# Patient Record
Sex: Female | Born: 1965 | Race: White | Hispanic: No | Marital: Married | Smoking: Former smoker
Health system: Southern US, Community
[De-identification: ages and names within clinical notes are randomized; demographics above are authoritative.]

## PROBLEM LIST (undated history)

## (undated) DIAGNOSIS — I10 Essential (primary) hypertension: Secondary | ICD-10-CM

## (undated) HISTORY — PX: GYNECOLOGIC CRYOSURGERY: SHX857

## (undated) HISTORY — DX: Essential (primary) hypertension: I10

## (undated) HISTORY — PX: DILATION AND CURETTAGE OF UTERUS: SHX78

---

## 1998-01-03 ENCOUNTER — Emergency Department (HOSPITAL_COMMUNITY): Admission: EM | Admit: 1998-01-03 | Discharge: 1998-01-03 | Payer: Self-pay | Admitting: Emergency Medicine

## 1998-01-03 ENCOUNTER — Encounter: Payer: Self-pay | Admitting: Emergency Medicine

## 1998-01-06 ENCOUNTER — Emergency Department (HOSPITAL_COMMUNITY): Admission: EM | Admit: 1998-01-06 | Discharge: 1998-01-06 | Payer: Self-pay | Admitting: Emergency Medicine

## 1998-02-07 HISTORY — PX: AUGMENTATION MAMMAPLASTY: SUR837

## 1998-04-10 ENCOUNTER — Observation Stay (HOSPITAL_COMMUNITY): Admission: EM | Admit: 1998-04-10 | Discharge: 1998-04-11 | Payer: Self-pay | Admitting: Plastic Surgery

## 1999-08-17 ENCOUNTER — Ambulatory Visit (HOSPITAL_BASED_OUTPATIENT_CLINIC_OR_DEPARTMENT_OTHER): Admission: RE | Admit: 1999-08-17 | Discharge: 1999-08-17 | Payer: Self-pay | Admitting: Plastic Surgery

## 1999-10-13 ENCOUNTER — Ambulatory Visit (HOSPITAL_COMMUNITY): Admission: RE | Admit: 1999-10-13 | Discharge: 1999-10-13 | Payer: Self-pay | Admitting: Obstetrics and Gynecology

## 1999-10-13 ENCOUNTER — Encounter (INDEPENDENT_AMBULATORY_CARE_PROVIDER_SITE_OTHER): Payer: Self-pay

## 2001-05-02 ENCOUNTER — Other Ambulatory Visit: Admission: RE | Admit: 2001-05-02 | Discharge: 2001-05-02 | Payer: Self-pay | Admitting: Obstetrics and Gynecology

## 2002-05-30 ENCOUNTER — Other Ambulatory Visit: Admission: RE | Admit: 2002-05-30 | Discharge: 2002-05-30 | Payer: Self-pay | Admitting: Obstetrics and Gynecology

## 2002-12-23 ENCOUNTER — Other Ambulatory Visit: Admission: RE | Admit: 2002-12-23 | Discharge: 2002-12-23 | Payer: Self-pay | Admitting: Gynecology

## 2002-12-26 ENCOUNTER — Encounter (INDEPENDENT_AMBULATORY_CARE_PROVIDER_SITE_OTHER): Payer: Self-pay | Admitting: Specialist

## 2002-12-26 ENCOUNTER — Ambulatory Visit (HOSPITAL_COMMUNITY): Admission: RE | Admit: 2002-12-26 | Discharge: 2002-12-26 | Payer: Self-pay | Admitting: Gynecology

## 2002-12-26 ENCOUNTER — Ambulatory Visit (HOSPITAL_BASED_OUTPATIENT_CLINIC_OR_DEPARTMENT_OTHER): Admission: RE | Admit: 2002-12-26 | Discharge: 2002-12-26 | Payer: Self-pay | Admitting: Gynecology

## 2003-07-16 ENCOUNTER — Other Ambulatory Visit: Admission: RE | Admit: 2003-07-16 | Discharge: 2003-07-16 | Payer: Self-pay | Admitting: Gynecology

## 2004-01-25 ENCOUNTER — Encounter (INDEPENDENT_AMBULATORY_CARE_PROVIDER_SITE_OTHER): Payer: Self-pay | Admitting: Specialist

## 2004-01-25 ENCOUNTER — Inpatient Hospital Stay (HOSPITAL_COMMUNITY): Admission: AD | Admit: 2004-01-25 | Discharge: 2004-01-27 | Payer: Self-pay | Admitting: Gynecology

## 2004-01-28 ENCOUNTER — Encounter: Admission: RE | Admit: 2004-01-28 | Discharge: 2004-01-28 | Payer: Self-pay | Admitting: Gynecology

## 2004-02-28 ENCOUNTER — Encounter: Admission: RE | Admit: 2004-02-28 | Discharge: 2004-03-29 | Payer: Self-pay | Admitting: Gynecology

## 2004-03-03 ENCOUNTER — Other Ambulatory Visit: Admission: RE | Admit: 2004-03-03 | Discharge: 2004-03-03 | Payer: Self-pay | Admitting: Gynecology

## 2005-02-22 ENCOUNTER — Other Ambulatory Visit: Admission: RE | Admit: 2005-02-22 | Discharge: 2005-02-22 | Payer: Self-pay | Admitting: Obstetrics and Gynecology

## 2006-07-20 ENCOUNTER — Ambulatory Visit (HOSPITAL_COMMUNITY): Admission: RE | Admit: 2006-07-20 | Discharge: 2006-07-20 | Payer: Self-pay | Admitting: Obstetrics and Gynecology

## 2007-01-09 ENCOUNTER — Other Ambulatory Visit: Admission: RE | Admit: 2007-01-09 | Discharge: 2007-01-09 | Payer: Self-pay | Admitting: Obstetrics and Gynecology

## 2008-03-12 ENCOUNTER — Ambulatory Visit: Payer: Self-pay | Admitting: Obstetrics and Gynecology

## 2008-03-12 ENCOUNTER — Other Ambulatory Visit: Admission: RE | Admit: 2008-03-12 | Discharge: 2008-03-12 | Payer: Self-pay | Admitting: Obstetrics and Gynecology

## 2008-03-12 ENCOUNTER — Encounter: Payer: Self-pay | Admitting: Obstetrics and Gynecology

## 2009-03-19 ENCOUNTER — Ambulatory Visit: Payer: Self-pay | Admitting: Obstetrics and Gynecology

## 2009-03-19 ENCOUNTER — Other Ambulatory Visit: Admission: RE | Admit: 2009-03-19 | Discharge: 2009-03-19 | Payer: Self-pay | Admitting: Obstetrics and Gynecology

## 2009-07-27 ENCOUNTER — Ambulatory Visit: Payer: Self-pay | Admitting: Obstetrics and Gynecology

## 2009-07-29 ENCOUNTER — Ambulatory Visit: Payer: Self-pay | Admitting: Obstetrics and Gynecology

## 2010-03-31 ENCOUNTER — Encounter: Payer: Self-pay | Admitting: Obstetrics and Gynecology

## 2010-04-07 ENCOUNTER — Encounter (INDEPENDENT_AMBULATORY_CARE_PROVIDER_SITE_OTHER): Payer: BC Managed Care – PPO | Admitting: Obstetrics and Gynecology

## 2010-04-07 ENCOUNTER — Other Ambulatory Visit: Payer: Self-pay | Admitting: Obstetrics and Gynecology

## 2010-04-07 ENCOUNTER — Other Ambulatory Visit (HOSPITAL_COMMUNITY)
Admission: RE | Admit: 2010-04-07 | Discharge: 2010-04-07 | Disposition: A | Discharge status: Home / Self Care | Payer: BC Managed Care – PPO | Source: Ambulatory Visit | Attending: Obstetrics and Gynecology | Admitting: Obstetrics and Gynecology

## 2010-04-07 DIAGNOSIS — Z1231 Encounter for screening mammogram for malignant neoplasm of breast: Secondary | ICD-10-CM

## 2010-04-07 DIAGNOSIS — Z01419 Encounter for gynecological examination (general) (routine) without abnormal findings: Secondary | ICD-10-CM

## 2010-04-07 DIAGNOSIS — Z124 Encounter for screening for malignant neoplasm of cervix: Secondary | ICD-10-CM | POA: Insufficient documentation

## 2010-04-07 DIAGNOSIS — Z13220 Encounter for screening for lipoid disorders: Secondary | ICD-10-CM

## 2010-04-20 ENCOUNTER — Ambulatory Visit: Payer: BC Managed Care – PPO

## 2010-04-29 ENCOUNTER — Ambulatory Visit
Admission: RE | Admit: 2010-04-29 | Discharge: 2010-04-29 | Disposition: A | Discharge status: Home / Self Care | Payer: BC Managed Care – PPO | Source: Ambulatory Visit | Attending: Obstetrics and Gynecology | Admitting: Obstetrics and Gynecology

## 2010-04-29 DIAGNOSIS — Z1231 Encounter for screening mammogram for malignant neoplasm of breast: Secondary | ICD-10-CM

## 2010-04-30 ENCOUNTER — Other Ambulatory Visit: Payer: Self-pay | Admitting: Obstetrics and Gynecology

## 2010-04-30 DIAGNOSIS — R928 Other abnormal and inconclusive findings on diagnostic imaging of breast: Secondary | ICD-10-CM

## 2010-05-06 ENCOUNTER — Ambulatory Visit
Admission: RE | Admit: 2010-05-06 | Discharge: 2010-05-06 | Disposition: A | Discharge status: Home / Self Care | Payer: BC Managed Care – PPO | Source: Ambulatory Visit | Attending: Obstetrics and Gynecology | Admitting: Obstetrics and Gynecology

## 2010-05-06 DIAGNOSIS — R928 Other abnormal and inconclusive findings on diagnostic imaging of breast: Secondary | ICD-10-CM

## 2010-05-07 ENCOUNTER — Other Ambulatory Visit: Payer: BC Managed Care – PPO

## 2010-06-25 NOTE — H&P (Signed)
NAME:  Connie Chambers, Connie Chambers                     ACCOUNT NO.:  [id number]   MEDICAL RECORD NO.:  [id number]                   PATIENT TYPE:  AMB   LOCATION:  [facility]                                 FACILITY:  [facility]   PHYSICIAN:  Timothy P. Fontaine, M.D.           DATE OF BIRTH:  06/14/1965   DATE OF ADMISSION:  DATE OF DISCHARGE:                                HISTORY & PHYSICAL   She is being admitted to [facility] on December 26, 2002 at  1 p.m.   CHIEF COMPLAINT:  Missed AB.   HISTORY OF PRESENT ILLNESS:  A 45 year old G2, P0, AB1 female at 10 weeks by  dates who presented for her new OB appointment, was found not to have fetal  heart tones, had an ultrasound which is consistent with a missed AB showing  a sac size of eight weeks and a crown-rump length of six weeks.  No fetal  activity.  The patient was counseled for options, and she is admitted for a  suction D&C.   PAST MEDICAL HISTORY:  Uncomplicated.   PAST SURGICAL HISTORY:  1. Breast implant surgery.  2. Wisdom teeth extraction.   ALLERGIES:  None.   MEDICATIONS:  Vitamins.   REVIEW OF SYSTEMS:  Noncontributory.   SOCIAL HISTORY:  Noncontributory.   FAMILY HISTORY:  Noncontributory.   EXAM:  HEENT:  Normal.  LUNGS:  Clear.  CARDIAC:  Regular rate.  No rubs, murmurs or gallops.  ABDOMEN:  Benign.  PELVIC:  External BUS, vagina normal.  Cervix normal.  Uterus retroverted,  10-weeks size, soft.  Adnexa without masses or tenderness.   ASSESSMENT:  A 45 year old gravida 2, para 0, abortion 1 female, ten weeks  by dates, with missed abortion for suction dilatation and curettage.   I reviewed with the patient and her husband the options to include expectant  management versus a suction D&C and they want to proceed with suction D&C.  I reviewed with them what is involved with the procedure.  The expected  intraoperative and postoperative courses.  I reviewed the risks of the  procedure to include  infection, bleeding, transfusion, uterine perforation,  damage to internal organs including bowel, bladder, ureters, vessels and  nerves and  sustaining major exploratory or reparative surgery in the future surgeries  including ostomy formation.  The patient's questions were answered to her  satisfaction.  She is ready to proceed with surgery.  The patient's blood  type is O positive.                                               Timothy P. Audie Box, M.D.    TPF/MEDQ  D:  12/25/2002  T:  12/25/2002  Job:  [id number]

## 2010-06-25 NOTE — H&P (Signed)
[facility]  Patient:    Connie Chambers, Connie Chambers                  MRN: [id number] Adm. Date:  [id number] Attending:  Sharon Mt                         History and Physical  CHIEF COMPLAINT:              Desire to terminate pregnancy.  HISTORY OF PRESENT ILLNESS:   The patient is a 45 year old gravida 1 para 0 AB 1, who came to see me on October 07, 1999 with a history of not having a period since early in May 2001.  She was not sure exactly when it had happened in early May 2001.  The patient has been using contraception with birth control pills for years and has never missed a birth control pill.  She had had periods of amenorrhea earlier in the year 2001 and had been seen in my office and evaluated.  She was never pregnant during these evaluations based on laboratory testing, and also had normal TSH and prolactin.  She came back to see me on October 07, 1999 because although she had had some bleeding off and on she had had nothing since Jun 08, 1999 in the way of a period and was starting to notice some abdominal bloating.  On examination she was obviously pregnant.  Her uterus was approximately 15 week size.  Urine pregnancy test confirmed that she was pregnant.  She does not want to continue the pregnancy and has elected to undergo second trimester termination.  She understands the risks of surgery including perforation, bowel injury, uncontrollable bleeding which would require hysterectomy, and is comfortable proceeding in spite of that.  A Laminaria tent was inserted yesterday to aid in cervical dilatation.  PAST MEDICAL HISTORY:         No serious illnesses.  She has had breast implant surgery.  SOCIAL HISTORY:               She is a smoker and a drinker, although she does not drink heavily.  FAMILY HISTORY:               Noncontributory.  REVIEW OF SYSTEMS:            HEENT: Negative.  CARDIAC: Negative. RESPIRATORY: Negative.  GI:  Abdominal bloating.  GU: Negative.  NEUROMUSCULAR: Negative.  PHYSICAL EXAMINATION:  GENERAL:                      The patient is a well-developed, well-nourished female in no acute distress.  VITAL SIGNS:                  Blood pressure 110/70, pulse 80 and regular, respirations 16 and unlabored.  She is afebrile.  HEENT:                        Within normal limits.  NECK:                         Supple.  Trachea midline.  Thyroid not enlarged.  LUNGS:                        Clear to auscultation and percussion.  HEART:  No thrills, heaves, or murmurs.  BREAST:                       No masses.  ABDOMEN:                      Soft, without rebound or guarding or masses except for the uterine fundus being palpable slightly half way to the umbilicus, but not quite half way to the umbilicus.  PELVIC:                       External genitalia within normal limits.  Cervix soft.  Uterus enlarged to approximately 15 week size.  Adnexa palpably normal.  RECTAL:                       Confirmatory.  ADMISSION IMPRESSION:         Intrauterine pregnancy with desire for termination.  PLAN:                         Therapeutic abortion. DD:  10/13/99 TD:  10/14/99 Job: [id number]

## 2010-06-25 NOTE — H&P (Signed)
Connie Chambers, Connie Chambers           ACCOUNT NO.:  [id number]   MEDICAL RECORD NO.:  [id number]          PATIENT TYPE:  INP   LOCATION:  9373                          FACILITY:  [facility]   PHYSICIAN:  Juan H. Lily Peer, M.D.DATE OF BIRTH:  [birth year]   DATE OF ADMISSION:  01/25/2004  DATE OF DISCHARGE:                                HISTORY & PHYSICAL   CHIEF COMPLAINT:  Contractions.   HISTORY OF PRESENT ILLNESS:  The patient is a 45 year old, gravida 3, para  0, AB 2 with a last menstrual period May 02, 2003.  Estimated date of  confinement February 07, 2004.  The patient is currently 38-2/7th weeks  gestation who presented to [facility] today complaining of  contractions every five to six minutes apart which started earlier this  evening.  The patient had been seen in the office at [facility]  on Friday and was told that she had what appeared to be labile PIH which  resolved with the patient being placed in the lateral decubitus position.  Today, she denied any right upper quadrant pain, any visual disturbances, or  any headaches and her systolic blood pressure was found to be 165 and  diastolic between 90 and 100, but returned down to 88 in the lateral  decubitus position on the diastolic.  Her deep tendon reflexes were 1+.  No  clonus, no edema.  No right upper quadrant pain.  No visual disturbances.   The patient's prenatal history was significant for the fact that she had  genetic amniocentesis at 15 to [redacted] weeks gestation which was reported to be  normal but the patient did not want to know the sex of the baby.  Her GBC  culture was negative and on exam there was evidence of a bloody show.  Her  cervix was 4 cm dilated, 90% effaced, and -1 station.  Fetal heart rate  tracing was reassuring.   ALLERGIES:  The patient denies any allergies.   PAST MEDICAL HISTORY:  Had anemia during this pregnancy and was on  supplemental iron along with her vitamin.  She was  tested in the past for  cystic fibrosis and had been negative.  Recent genetic amniocentesis was  reported to be normal.   REVIEW OF SYMPTOMS:  See [facility] form.   PHYSICAL EXAMINATION:  GENERAL:  Well-developed, well-nourished female.  HEENT:  Unremarkable.  NECK:  Supple.  Trachea midline.  No carotid bruits or thyromegaly.  LUNGS:  Clear to auscultation without any rhonchi or wheezes.  HEART:  Regular rate and rhythm.  No murmurs or gallops.  BREASTS:  Not done.  ABDOMEN: Gravid uterus.  Vertex presentation by [facility] maneuver.  PELVIC:  Cervix thin, 4 cm, 90+ effacement, -1 station.  EXTREMITIES:  DTRs 1+.  Negative clonus.  No edema.   ASSESSMENT:  A 45 year old, gravid 3, para 0, abortions 2 at 38-1/2 weeks  estimated gestational age in labor, advanced cervical dilatation, pregnancy-  induced hypertension.  We will proceed with pregnancy-induced  hypertension labs and continue to monitor blood pressure in the event that  she may need to be placed on magnesium  sulfate for seizure prophylaxis.  She  is group beta strep negative and prenatal labs (see [place] form).   PLAN:  As per assessment above.     Juan   JHF/MEDQ  D:  01/25/2004  T:  01/25/2004  Job:  [id number]

## 2010-06-25 NOTE — Op Note (Signed)
NAME:  Connie Chambers, Connie Chambers                     ACCOUNT NO.:  [id number]   MEDICAL RECORD NO.:  [id number]                   PATIENT TYPE:  AMB   LOCATION:  [facility]                                 FACILITY:  [facility]   PHYSICIAN:  Timothy P. Fontaine, M.D.           DATE OF BIRTH:  January 02, 1966   DATE OF PROCEDURE:  12/26/2002  DATE OF DISCHARGE:                                 OPERATIVE REPORT   [facility]   PREOPERATIVE DIAGNOSIS:  Missed abortion.   POSTOPERATIVE DIAGNOSIS:  Missed abortion.   PROCEDURE:  Suction D&C.   SURGEON:  Timothy P. Fontaine, M.D.   ANESTHETIC:  General.   ESTIMATED BLOOD LOSS:  Less than 50 mL.   COMPLICATIONS:  None.   SPECIMENS:  POC.   FINDINGS:  Uterus 10-weeks size, retroverted.  Gross POC retrieved, #8  suction curet.  No evidence of perforation.  EUA shows uterus to be  retroverted, 10-weeks size, adnexa without masses.   PROCEDURE:  Patient was taken to the operating room, underwent general  anesthesia, and was placed in the low dorsal lithotomy position.  She  received a vaginal-perineal preparation with Betadine solution.  Bladder  emptied with in-and-out Foley catheterization, and EUA was performed.  The  patient was then draped in the usual fashion.  The cervix visualized with a  speculum, anterior lip gripped with a single-tooth tenaculum and the cervix  was gently gradually dilated to admit a #8 suction curet.  The suction  curettage was then performed with gross POC retrieved.  A gentle sharp  curettage was then performed to assure complete emptying.  The instruments  were removed, adequate hemostasis visualized, patient placed in the supine  position, awakened without difficulty, and taken to the recovery room in  good condition having tolerated the procedure well and receiving Toradol  before leaving the room.                                               Timothy P. Audie Box, M.D.    TPF/MEDQ  D:  12/26/2002  T:   12/26/2002  Job:  [id number]

## 2010-06-25 NOTE — Op Note (Signed)
[facility]  Patient:    Connie Chambers, Connie Chambers                    MRN: [id number] Proc. Date: 10/13/99 Attending:  Rande Brunt. Eda Paschal, M.D.                           Operative Report  PREOPERATIVE DIAGNOSIS:       Therapeutic abortion.  POSTOPERATIVE DIAGNOSIS:      Therapeutic abortion.  OPERATION:                    Dilatation and evacuation.  SURGEON:                      Daniel L. Eda Paschal, M.D.  ANESTHESIA:                   General endotracheal.  FINDINGS:                     External and vaginal exam is within normal limits.  Cervix is clean.  It is significantly dilated from the laminaria tent that had been placed the night before surgery.  Uterus is enlarged to 15-16 weeks size by intrauterine pregnancy.  Adnexa were not palpably enlarged.  DESCRIPTION OF PROCEDURE:     The patient was taken to the operating room, given general endotracheal anesthesia, and then placed in the dorsal lithotomy position.  Her laminaria tent was removed, and there had been significant dilatation of the cervix.  She was prepped and draped in the usual sterile manner.  A single-tooth tenaculum was placed in the anterior lip of the cervix, and the cervix was progressively dilated to a #45 Pratt dilator.  The products of conception were then removed with a #16 suction curette attached to large tubing.  Some of the tissue had to be removed in pieces with a variety of instruments utilized for second trimester terminations which included sponge sticks and other specially shaped curettes to grasp tissue. Sharp curettage was never done at any time.  Once the uterus appeared to be evacuated, all tissue was checked for completeness, and it was felt that the entire conception had been removed.  The patient was given Pitocin IV.  Her uterus contracted well without significant bleeding.  Estimated blood loss was less than 300 cc with none replaced.  The patient tolerated  the procedure well and left the operating room in satisfactory condition. DD:  10/13/99 TD:  10/14/99 Job: [id number]

## 2010-12-14 ENCOUNTER — Other Ambulatory Visit: Payer: Self-pay | Admitting: Obstetrics and Gynecology

## 2010-12-14 NOTE — Telephone Encounter (Signed)
You may refilled her Xanax with one additional refill. The other medicine is for her blood pressure and I am not to 1 who prescribes it.

## 2010-12-17 MED ORDER — ALPRAZOLAM 0.25 MG PO TABS: 0.2500 mg | ORAL_TABLET | Freq: Every evening | ORAL | Status: AC | PRN

## 2010-12-17 NOTE — Telephone Encounter (Signed)
Called in both RX's to pharmacy.

## 2010-12-17 NOTE — Telephone Encounter (Signed)
Spoke with pt. And states you had refilled her Bystolic rx for per previously because the cardiologist she had been seeing left the practice she had been going to & now has an appt. With Dr. Alanda Amass December 2012. PT. Asking if she can get just #30 more until she sees new cardiologist next month?

## 2010-12-17 NOTE — Telephone Encounter (Signed)
Okay to refill xanax with two refills. Okay to refill bystolic until she sees Dr. Alanda Amass in dec.

## 2011-04-01 ENCOUNTER — Other Ambulatory Visit: Payer: Self-pay | Admitting: *Deleted

## 2011-04-01 MED ORDER — ALPRAZOLAM 0.25 MG PO TABS: 0.2500 mg | ORAL_TABLET | Freq: Every evening | ORAL | Status: AC | PRN

## 2011-04-01 NOTE — Telephone Encounter (Signed)
rx called in

## 2011-08-03 ENCOUNTER — Other Ambulatory Visit: Payer: Self-pay | Admitting: *Deleted

## 2011-08-03 MED ORDER — ALPRAZOLAM 0.25 MG PO TABS: 0.2500 mg | ORAL_TABLET | Freq: Every evening | ORAL | Status: DC | PRN

## 2011-08-03 NOTE — Telephone Encounter (Signed)
rx called in

## 2011-08-05 ENCOUNTER — Encounter: Payer: Self-pay | Admitting: Gynecology

## 2011-08-05 DIAGNOSIS — I10 Essential (primary) hypertension: Secondary | ICD-10-CM | POA: Insufficient documentation

## 2011-08-17 ENCOUNTER — Encounter: Payer: Self-pay | Admitting: Obstetrics and Gynecology

## 2011-08-17 ENCOUNTER — Ambulatory Visit (INDEPENDENT_AMBULATORY_CARE_PROVIDER_SITE_OTHER): Payer: BC Managed Care – PPO | Admitting: Obstetrics and Gynecology

## 2011-08-17 VITALS — BP 124/78 | Ht 64.0 in | Wt 124.0 lb

## 2011-08-17 DIAGNOSIS — Z01419 Encounter for gynecological examination (general) (routine) without abnormal findings: Secondary | ICD-10-CM

## 2011-08-17 LAB — CBC WITH DIFFERENTIAL/PLATELET
Basophils Relative: 0 % (ref 0–1)
Eosinophils Absolute: 0.1 10*3/uL (ref 0.0–0.7)
Lymphs Abs: 1.9 10*3/uL (ref 0.7–4.0)
MCH: 31.2 pg (ref 26.0–34.0)
Neutro Abs: 5.1 10*3/uL (ref 1.7–7.7)
Neutrophils Relative %: 67 % (ref 43–77)
Platelets: 283 10*3/uL (ref 150–400)
RBC: 4.07 MIL/uL (ref 3.87–5.11)
WBC: 7.6 10*3/uL (ref 4.0–10.5)

## 2011-08-17 NOTE — Progress Notes (Signed)
Patient came to see me today for her annual GYN exam. She is having regular cycles without problems. She still sees the cardiologist who treats her blood pressure. She had a normal lipid profile here last year. She contracepts by vasectomy. She is due for a mammogram. She is having no irregular bleeding. She is having no pelvic pain. 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 She has had normal Pap smears ever since. Her last Pap smear was last year.  Physical examination: Connie Chambers present. HEENT within normal limits. Neck: Thyroid not large. No masses. Supraclavicular nodes: not enlarged. Breasts: Examined in both sitting and lying  position. No skin changes and no masses. Abdomen: Soft no guarding rebound or masses or hernia. Pelvic: External: Within normal limits. BUS: Within normal limits. Vaginal:within normal limits. Good estrogen effect. No evidence of cystocele rectocele or enterocele. Cervix: clean. Uterus: Normal size and shape. Adnexa: No masses. Rectovaginal exam: Confirmatory and negative. Extremities: Within normal limits.  Assessment: Normal GYN exam. Hypertension.  Plan: Mammogram.The new Pap smear guidelines were discussed with the patient. No pap done.

## 2011-08-18 LAB — URINALYSIS W MICROSCOPIC + REFLEX CULTURE
Bacteria, UA: NONE SEEN
Bilirubin Urine: NEGATIVE
Glucose, UA: NEGATIVE mg/dL
Hgb urine dipstick: NEGATIVE
Ketones, ur: NEGATIVE mg/dL
Protein, ur: NEGATIVE mg/dL
Squamous Epithelial / LPF: NONE SEEN

## 2011-10-18 ENCOUNTER — Other Ambulatory Visit: Payer: Self-pay | Admitting: *Deleted

## 2011-10-18 MED ORDER — ALPRAZOLAM 0.25 MG PO TABS: 0.2500 mg | ORAL_TABLET | Freq: Every evening | ORAL | Status: AC | PRN

## 2011-11-28 ENCOUNTER — Other Ambulatory Visit: Payer: Self-pay | Admitting: Obstetrics and Gynecology

## 2012-01-20 ENCOUNTER — Telehealth: Payer: Self-pay | Admitting: *Deleted

## 2012-01-20 MED ORDER — ALPRAZOLAM 0.25 MG PO TABS: ORAL_TABLET | ORAL | Status: DC

## 2012-01-20 NOTE — Telephone Encounter (Signed)
(  pt aware you are out of office) Pt calling requesting refill on Xanax 0.25 mg tablets. Okay to fill? Refills?

## 2012-01-20 NOTE — Addendum Note (Signed)
Addended by: Aura Camps on: 01/20/2012 10:04 AM   Modules accepted: Orders

## 2012-01-20 NOTE — Telephone Encounter (Signed)
Give 30 pills with 5 refills.

## 2012-01-20 NOTE — Telephone Encounter (Signed)
rx called into pharmacy

## 2012-02-28 ENCOUNTER — Other Ambulatory Visit: Payer: Self-pay | Admitting: Obstetrics and Gynecology

## 2012-02-28 NOTE — Telephone Encounter (Signed)
This prescription request denied. I spoke with Connie Chambers at Pharmacy and she said she has a RX at pharmacy with refills therefore this refill not needed. KW The patient takes this due to anxiety per Dr Delorise Royals note in 2011. KW

## 2012-03-05 ENCOUNTER — Other Ambulatory Visit: Payer: Self-pay | Admitting: Obstetrics and Gynecology

## 2012-03-05 NOTE — Telephone Encounter (Signed)
Per Dr Cephas Darby last note refill ok x5. Pt takes for sleep. KW

## 2012-06-29 ENCOUNTER — Other Ambulatory Visit: Payer: Self-pay | Admitting: Family Medicine

## 2012-06-29 DIAGNOSIS — R2241 Localized swelling, mass and lump, right lower limb: Secondary | ICD-10-CM

## 2012-07-03 ENCOUNTER — Ambulatory Visit
Admission: RE | Admit: 2012-07-03 | Discharge: 2012-07-03 | Disposition: A | Discharge status: Home / Self Care | Payer: Managed Care, Other (non HMO) | Source: Ambulatory Visit | Attending: Family Medicine | Admitting: Family Medicine

## 2012-07-03 DIAGNOSIS — R2241 Localized swelling, mass and lump, right lower limb: Secondary | ICD-10-CM

## 2012-08-17 ENCOUNTER — Encounter: Payer: Self-pay | Admitting: Gynecology

## 2012-09-06 ENCOUNTER — Ambulatory Visit (INDEPENDENT_AMBULATORY_CARE_PROVIDER_SITE_OTHER): Payer: Managed Care, Other (non HMO) | Admitting: Gynecology

## 2012-09-06 ENCOUNTER — Encounter: Payer: Self-pay | Admitting: Gynecology

## 2012-09-06 VITALS — BP 112/68 | Ht 64.0 in | Wt 118.0 lb

## 2012-09-06 DIAGNOSIS — Z01419 Encounter for gynecological examination (general) (routine) without abnormal findings: Secondary | ICD-10-CM

## 2012-09-06 MED ORDER — ALPRAZOLAM 0.25 MG PO TABS: ORAL_TABLET | ORAL | Status: DC

## 2012-09-06 NOTE — Patient Instructions (Signed)
Call to Schedule your mammogram  Facilities in [place]: 1)  [facility], [address] Phone: [phone] 2)  [facility], [address] Phone: [phone] 3)  Dr. Bertrand at [facility]  [address] Phone: [phone]     Mammogram A mammogram is an X-ray test to find changes in a woman's breast. You should get a mammogram if:  You are 40 years of age or older  You have risk factors.   Your doctor recommends that you have one.  BEFORE THE TEST  Do not schedule the test the week before your period, especially if your breasts are sore during this time.  On the day of your mammogram:  Wash your breasts and armpits well. After washing, do not put on any deodorant or talcum powder on until after your test.   Eat and drink as you usually do.   Take your medicines as usual.   If you are diabetic and take insulin, make sure you:   Eat before coming for your test.   Take your insulin as usual.   If you cannot keep your appointment, call before the appointment to cancel. Schedule another appointment.  TEST  You will need to undress from the waist up. You will put on a hospital gown.   Your breast will be put on the mammogram machine, and it will press firmly on your breast with a piece of plastic called a compression paddle. This will make your breast flatter so that the machine can X-ray all parts of your breast.   Both breasts will be X-rayed. Each breast will be X-rayed from above and from the side. An X-ray might need to be taken again if the picture is not good enough.   The mammogram will last about 15 to 30 minutes.  AFTER THE TEST Finding out the results of your test Ask when your test results will be ready. Make sure you get your test results.  Document Released: 04/22/2008 Document Revised: 01/13/2011 Document Reviewed: 04/22/2008 [employer] Patient  Information 2012 [employer].   

## 2012-09-06 NOTE — Progress Notes (Signed)
Connie Chambers Sep 08, 1965 [id number]        47 y.o.  [id number] for annual exam.  Former patient of Dr. Eda Paschal doing well.  Past medical history,surgical history, medications, allergies, family history and social history were all reviewed and documented in the EPIC chart.  ROS:  Performed and pertinent positives and negatives are included in the history, assessment and plan .  Exam: Kim assistant Filed Vitals:   09/06/12 1537  BP: 112/68  Height: 5\' 4"  (1.626 m)  Weight: 118 lb (53.524 kg)   General appearance  Normal Skin grossly normal Head/Neck normal with no cervical or supraclavicular adenopathy thyroid normal Lungs  clear Cardiac RR, without RMG Abdominal  soft, nontender, without masses, organomegaly or hernia Breasts  examined lying and sitting. Right without masses, retractions, discharge or axillary adenopathy. Left with distortion from implant complication. No masses retractions discharge adenopathy. Bilateral implants noted. Pelvic  Ext/BUS/vagina  normal   Cervix  normal   Uterus  anteverted, normal size, shape and contour, midline and mobile nontender   Adnexa  Without masses or tenderness    Anus and perineum  normal   Rectovaginal  normal sphincter tone without palpated masses or tenderness.    Assessment/Plan:  47 y.o. [id number] female for annual exam, regular menses, vasectomy birth control.   1. Breakthrough bleeding. Patient has had midcycle spotting for several cycles over the past several months. Not consistently each month but has happened several times. No pain or other symptoms. Recommend observation at present. If it continues for the next several cycles she knows to call me and we'll start with sonohysterogram to rule out polyp or other structural abnormalities. If resolves and she resumes regular monthly menses then we will follow. 2. Pap smear 2012. No Pap smear done today. History of cryosurgery in college with normal Pap smears since then. Plan  repeat Pap smear next year a 3 year interval. 3. Mammography overdue. Patient is to schedule and agrees to do so. SBE monthly reviewed. 4. Anxiety. Patient uses Xanax 0.25 mg when necessary for situational anxiety. #30 with 4 refills provided. 5. Health maintenance. No blood work done as it is all done through her cardiologist's office. Followup one year, sooner as needed.  Note: This document was prepared with digital dictation and possible smart phrase technology. Any transcriptional errors that result from this process are unintentional.   Dara Lords MD, 4:05 PM 09/06/2012

## 2012-09-07 LAB — URINALYSIS W MICROSCOPIC + REFLEX CULTURE
Glucose, UA: NEGATIVE mg/dL
Leukocytes, UA: NEGATIVE
Protein, ur: NEGATIVE mg/dL
Squamous Epithelial / LPF: NONE SEEN
Urobilinogen, UA: 0.2 mg/dL (ref 0.0–1.0)

## 2012-09-12 ENCOUNTER — Encounter: Payer: Self-pay | Admitting: Obstetrics and Gynecology

## 2012-09-24 ENCOUNTER — Other Ambulatory Visit: Payer: Self-pay | Admitting: Cardiology

## 2012-09-24 ENCOUNTER — Other Ambulatory Visit: Payer: Self-pay | Admitting: Gynecology

## 2012-09-24 NOTE — Telephone Encounter (Signed)
At her office visit 09/06/2012 I have prescribed Xanax #30 with 4 refills. Can you check with the pharmacy to see if this prescription went through.

## 2012-09-24 NOTE — Telephone Encounter (Signed)
I called in 09/06/12 Rx today. Pharmacy had not received it prior.  Cannot be prescribed electronically.

## 2012-09-24 NOTE — Telephone Encounter (Signed)
Rx was sent to pharmacy electronically. 

## 2012-12-21 ENCOUNTER — Ambulatory Visit (INDEPENDENT_AMBULATORY_CARE_PROVIDER_SITE_OTHER): Payer: Managed Care, Other (non HMO) | Admitting: Gynecology

## 2012-12-21 ENCOUNTER — Encounter: Payer: Self-pay | Admitting: Gynecology

## 2012-12-21 DIAGNOSIS — IMO0002 Reserved for concepts with insufficient information to code with codable children: Secondary | ICD-10-CM

## 2012-12-21 DIAGNOSIS — S01512A Laceration without foreign body of oral cavity, initial encounter: Secondary | ICD-10-CM

## 2012-12-21 NOTE — Progress Notes (Signed)
The patient presents having undergone any bikini waxing today. When the person pulled the wax off she received a laceration in her labial area.   Exam with Selena Batten assistant External BUS vagina with linear superficial laceration right periclitoral fold along junction of the labia majora and labia minora to the posterior fourchette. Reapproximates on its on. No bleeding. Cervix normal. Uterus normal size midline mobile nontender. Adnexa without masses or tenderness.  Assessment and plan: Superficial right labial laceration secondary to waxing. Reapproximates nicely on its on. No active bleeding. Do not feel sutures needed for reapproximation reinforcement at this point. Recommend ice packs over the area for the next 48 hours. Sitz baths 3 times a day. Followup if area does not heal over the next one to 2 weeks. Certainly sooner if increasing discomfort, drainage or any evidence of infection.

## 2012-12-21 NOTE — Patient Instructions (Signed)
Ice packs to the vulva over the next 48 hours. Sitz baths 3 times daily. Followup if condition persists or worsens

## 2013-04-22 ENCOUNTER — Other Ambulatory Visit: Payer: Self-pay

## 2013-04-22 DIAGNOSIS — Z1231 Encounter for screening mammogram for malignant neoplasm of breast: Secondary | ICD-10-CM

## 2013-05-02 ENCOUNTER — Ambulatory Visit
Admission: RE | Admit: 2013-05-02 | Discharge: 2013-05-02 | Disposition: A | Discharge status: Home / Self Care | Payer: Managed Care, Other (non HMO) | Source: Ambulatory Visit

## 2013-05-02 DIAGNOSIS — Z1231 Encounter for screening mammogram for malignant neoplasm of breast: Secondary | ICD-10-CM

## 2013-05-06 ENCOUNTER — Other Ambulatory Visit: Payer: Self-pay | Admitting: Gynecology

## 2013-05-07 NOTE — Telephone Encounter (Signed)
Called into pharmacy

## 2013-09-24 ENCOUNTER — Encounter: Payer: Self-pay | Admitting: Gynecology

## 2013-09-24 ENCOUNTER — Ambulatory Visit (INDEPENDENT_AMBULATORY_CARE_PROVIDER_SITE_OTHER): Payer: Managed Care, Other (non HMO) | Admitting: Gynecology

## 2013-09-24 ENCOUNTER — Other Ambulatory Visit (HOSPITAL_COMMUNITY)
Admission: RE | Admit: 2013-09-24 | Discharge: 2013-09-24 | Disposition: A | Discharge status: Home / Self Care | Payer: Managed Care, Other (non HMO) | Source: Ambulatory Visit | Attending: Gynecology | Admitting: Gynecology

## 2013-09-24 VITALS — BP 110/66 | Ht 64.0 in | Wt 122.0 lb

## 2013-09-24 DIAGNOSIS — Z01419 Encounter for gynecological examination (general) (routine) without abnormal findings: Secondary | ICD-10-CM | POA: Diagnosis present

## 2013-09-24 DIAGNOSIS — N920 Excessive and frequent menstruation with regular cycle: Secondary | ICD-10-CM

## 2013-09-24 DIAGNOSIS — Z1151 Encounter for screening for human papillomavirus (HPV): Secondary | ICD-10-CM | POA: Diagnosis present

## 2013-09-24 DIAGNOSIS — N921 Excessive and frequent menstruation with irregular cycle: Secondary | ICD-10-CM

## 2013-09-24 LAB — LIPID PANEL
Cholesterol: 165 mg/dL (ref 0–200)
HDL: 86 mg/dL (ref >39)
LDL CALC: 67 mg/dL (ref 0–99)
Total CHOL/HDL Ratio: 1.9 Ratio
Triglycerides: 59 mg/dL (ref <150)
VLDL: 12 mg/dL (ref 0–40)

## 2013-09-24 LAB — CBC WITH DIFFERENTIAL/PLATELET
BASOS ABS: 0 10*3/uL (ref 0.0–0.1)
BASOS PCT: 0 % (ref 0–1)
Eosinophils Absolute: 0.1 10*3/uL (ref 0.0–0.7)
Eosinophils Relative: 1 % (ref 0–5)
HCT: 35.7 % — ABNORMAL LOW (ref 36.0–46.0)
HEMOGLOBIN: 12.2 g/dL (ref 12.0–15.0)
Lymphocytes Relative: 19 % (ref 12–46)
Lymphs Abs: 1 10*3/uL (ref 0.7–4.0)
MCH: 31.4 pg (ref 26.0–34.0)
MCHC: 34.2 g/dL (ref 30.0–36.0)
MCV: 91.8 fL (ref 78.0–100.0)
MONOS PCT: 9 % (ref 3–12)
Monocytes Absolute: 0.5 10*3/uL (ref 0.1–1.0)
NEUTROS ABS: 3.8 10*3/uL (ref 1.7–7.7)
NEUTROS PCT: 71 % (ref 43–77)
Platelets: 243 10*3/uL (ref 150–400)
RBC: 3.89 MIL/uL (ref 3.87–5.11)
RDW: 13 % (ref 11.5–15.5)
WBC: 5.4 10*3/uL (ref 4.0–10.5)

## 2013-09-24 LAB — COMPREHENSIVE METABOLIC PANEL
ALBUMIN: 4.4 g/dL (ref 3.5–5.2)
ALK PHOS: 41 U/L (ref 39–117)
ALT: 9 U/L (ref 0–35)
AST: 21 U/L (ref 0–37)
BUN: 9 mg/dL (ref 6–23)
CO2: 26 mEq/L (ref 19–32)
Calcium: 9.1 mg/dL (ref 8.4–10.5)
Chloride: 102 mEq/L (ref 96–112)
Creat: 1.05 mg/dL (ref 0.50–1.10)
Glucose, Bld: 76 mg/dL (ref 70–99)
POTASSIUM: 4.3 meq/L (ref 3.5–5.3)
SODIUM: 137 meq/L (ref 135–145)
TOTAL PROTEIN: 7.1 g/dL (ref 6.0–8.3)
Total Bilirubin: 0.5 mg/dL (ref 0.2–1.2)

## 2013-09-24 MED ORDER — ALPRAZOLAM 0.25 MG PO TABS: ORAL_TABLET | ORAL | Status: DC

## 2013-09-24 NOTE — Addendum Note (Signed)
Addended by: Dayna BarkerGARDNER, Benson Porcaro K on: 09/24/2013 11:58 AM   Modules accepted: Orders

## 2013-09-24 NOTE — Patient Instructions (Signed)
Follow up for lab results and ultrasound as scheduled. 

## 2013-09-24 NOTE — Progress Notes (Signed)
Connie SarasStephanie M Salley [birth year] [id number]        48 y.o.  [id number] for annual exam.  Several issues noted below.  Past medical history,surgical history, problem list, medications, allergies, family history and social history were all reviewed and documented as reviewed in the EPIC chart.  ROS:  12 system ROS performed with pertinent positives and negatives included in the history, assessment and plan.   Additional significant findings :  None   Exam: [facility] personassistant Filed Vitals:   09/24/13 1059  BP: 110/66  Height: 5\' 4"  (1.626 m)  Weight: 122 lb (55.339 kg)   General appearance:  Normal affect, orientation and appearance. Skin: Grossly normal HEENT: Without gross lesions.  No cervical or supraclavicular adenopathy. Thyroid normal.  Lungs:  Clear without wheezing, rales or rhonchi Cardiac: RR, without RMG Abdominal:  Soft, nontender, without masses, guarding, rebound, organomegaly or hernia Breasts:  Examined lying and sitting without masses, retractions, discharge or axillary adenopathy. Bilateral implants noted. Old left breast distortion from her surgery. Pelvic:  Ext/BUS/vagina normal  Cervix normal. Pap/HPV  Uterus anteverted, normal size, shape and contour, midline and mobile nontender   Adnexa  Without masses or tenderness    Anus and perineum  Normal   Rectovaginal  Normal sphincter tone without palpated masses or tenderness.    Assessment/Plan:  48 y.o. [id number] female for annual exam with heavy menses and intermenstrual spotting, vasectomy birth control.   1. Menorrhagia/intermenstrual spotting. Seems to be worsening over the past year. Exam overall is normal. Will check baseline FSH TSH hemoglobin and sonohysterogram. Various scenarios to include hormonal dysfunction, polyps, leiomyoma reviewed. Options to include hysteroscopic resection, hormonal manipulation, IUD, endometrial ablation, hysterectomy also discussed. Patient will followup for lab results and sonohysterogram  and then we'll further review options. 2. Pap smear 2012. Pap/HPV today. History of cryosurgery in college with normal Pap smears afterwards. Assuming negative then plan 3-5 year followup Pap per current screening guidelines. 3. Mammography 04/2013. Continue with annual mammography. 4. Anxiety. Patient uses Xanax 0.25 mg intermittently for anxiety. Does well with this. #30 with 3 refills provided. 5. Health maintenance. Baseline CBC comprehensive metabolic panel lipid profile urinalysis ordered with above lab work. Followup for lab results and sonohysterogram.   Note: This document was prepared with digital dictation and possible smart phrase technology. Any transcriptional errors that result from this process are unintentional.   Dara LordsFONTAINE,Risha Barretta P MD, 11:29 AM 09/24/2013

## 2013-09-25 LAB — URINALYSIS W MICROSCOPIC + REFLEX CULTURE
Bacteria, UA: NONE SEEN
Bilirubin Urine: NEGATIVE
CASTS: NONE SEEN
CRYSTALS: NONE SEEN
Glucose, UA: NEGATIVE mg/dL
Hgb urine dipstick: NEGATIVE
Ketones, ur: NEGATIVE mg/dL
LEUKOCYTES UA: NEGATIVE
NITRITE: NEGATIVE
PH: 6 (ref 5.0–8.0)
Protein, ur: NEGATIVE mg/dL
SPECIFIC GRAVITY, URINE: 1.006 (ref 1.005–1.030)
Squamous Epithelial / LPF: NONE SEEN
Urobilinogen, UA: 0.2 mg/dL (ref 0.0–1.0)

## 2013-09-25 LAB — FOLLICLE STIMULATING HORMONE: FSH: 8.9 m[IU]/mL

## 2013-09-25 LAB — TSH: TSH: 1.13 u[IU]/mL (ref 0.350–4.500)

## 2013-09-26 ENCOUNTER — Other Ambulatory Visit: Payer: Self-pay | Admitting: Gynecology

## 2013-09-26 DIAGNOSIS — N921 Excessive and frequent menstruation with irregular cycle: Secondary | ICD-10-CM

## 2013-09-26 LAB — CYTOLOGY - PAP

## 2013-10-16 ENCOUNTER — Ambulatory Visit: Payer: Managed Care, Other (non HMO) | Admitting: Gynecology

## 2013-10-16 ENCOUNTER — Other Ambulatory Visit: Payer: Managed Care, Other (non HMO)

## 2013-10-21 ENCOUNTER — Other Ambulatory Visit: Payer: Managed Care, Other (non HMO)

## 2013-10-21 ENCOUNTER — Ambulatory Visit: Payer: Managed Care, Other (non HMO) | Admitting: Gynecology

## 2013-12-09 ENCOUNTER — Encounter: Payer: Self-pay | Admitting: Gynecology

## 2014-09-30 ENCOUNTER — Other Ambulatory Visit: Payer: Self-pay

## 2014-09-30 DIAGNOSIS — Z1231 Encounter for screening mammogram for malignant neoplasm of breast: Secondary | ICD-10-CM

## 2014-10-24 ENCOUNTER — Ambulatory Visit (INDEPENDENT_AMBULATORY_CARE_PROVIDER_SITE_OTHER): Payer: Managed Care, Other (non HMO) | Admitting: Cardiology

## 2014-10-24 ENCOUNTER — Encounter: Payer: Self-pay | Admitting: Cardiology

## 2014-10-24 VITALS — BP 118/88 | HR 57 | Ht 64.0 in | Wt 121.0 lb

## 2014-10-24 DIAGNOSIS — Z8679 Personal history of other diseases of the circulatory system: Secondary | ICD-10-CM

## 2014-10-24 DIAGNOSIS — Z87898 Personal history of other specified conditions: Secondary | ICD-10-CM

## 2014-10-24 DIAGNOSIS — I10 Essential (primary) hypertension: Secondary | ICD-10-CM

## 2014-10-24 NOTE — Patient Instructions (Signed)
No change with current medications   Your physician recommends that you schedule a follow-up appointment on an as needed basis.

## 2014-10-24 NOTE — Progress Notes (Signed)
PATIENT: Connie Chambers MRN: [id number] DOB: [birth year] PCP: Dara Lords, MD  Clinic Note: Chief Complaint  Patient presents with  . Follow-up    Patient has no complaints.    HPI: Connie Chambers is a 49 y.o. female with a PMH below who presents today for management of HTN.  Last seen Jan 2014 - was on Bystolic then.  No longer on it - b/c did not have refills.  Interval History: Feels good & exercises.  No major complaints.  She has been off of Bystolic now for almost a year - with only one elevated blood pressure reading that she can tell me in last year.  She is here today for reevaluation because she had a BP check @ [facility] 160/110 mHg.  Otherwise, she really has not had any evaluations at her PCPs office or when she checks herself of elevated blood pressures. No headaches or blurred vision. No palpitations.  She is usually very active, watching her diet and exercises routinely.  Cardiovascular ROS: no chest pain or dyspnea on exertion negative for - edema, irregular heartbeat, orthopnea, palpitations, paroxysmal nocturnal dyspnea, rapid heart rate or shortness of breath, syncope/near-syncope, TIA/amaurosis fugax  Past Medical History  Diagnosis Date  . Hypertension     Well-controlled - without medications  . Cervical dysplasia [employer]    Normal Pap smears since    Prior Cardiac Evaluation and Past Surgical History: Past Surgical History  Procedure Laterality Date  . Dilation and curettage of uterus    . Augmentation mammaplasty    . Gynecologic cryosurgery  In college    No Known Allergies  Current Outpatient Prescriptions  Medication Sig Dispense Refill  . ALPRAZolam (XANAX) 0.25 MG tablet TAKE 1 TABLET BY MOUTH AT BEDTIME 30 tablet 3   No current facility-administered medications for this visit.    Social History   Social History Narrative    family history includes Stroke in her paternal grandfather.  ROS: A comprehensive Review of  Systems - was performed Review of Systems  Constitutional: Negative for weight loss and malaise/fatigue.  HENT: Negative for nosebleeds.   Respiratory: Negative for shortness of breath.   Cardiovascular: Negative for claudication.  Gastrointestinal: Negative for blood in stool and melena.  Genitourinary: Negative for hematuria.  Musculoskeletal: Negative for myalgias and joint pain.  Neurological: Negative for dizziness.  Psychiatric/Behavioral: Negative for depression. The patient is not nervous/anxious.   All other systems reviewed and are negative.   PHYSICAL EXAM BP 118/88 mmHg  Pulse 57  Ht  (1.626 m)  Wt 121 lb (54.885 kg)  BMI 20.76 kg/m2 General appearance: alert, cooperative, appears stated age, no distress and Pleasant mood and affect. Healthy-appearing well-groomed. Neck: no adenopathy, no carotid bruit, no JVD and supple, symmetrical, trachea midline Lungs: clear to auscultation bilaterally, normal percussion bilaterally and Nonlabored, good air movement Heart: regular rate and rhythm, S1, S2 normal, no murmur, click, rub or gallop and normal apical impulse Abdomen: soft, non-tender; bowel sounds normal; no masses,  no organomegaly Extremities: extremities normal, atraumatic, no cyanosis or edema Pulses: 2+ and symmetric Neurologic: Alert and oriented X 3, normal strength and tone. Normal symmetric reflexes. Normal coordination and gait   Adult ECG Report  Rate: 57 ;  Rhythm: sinus bradycardia and Nonspecific ST-T abnormalities; normal axis, intervals, durations & voltage  Narrative Interpretation: Stable EKG  Recent Labs: n/a  ASSESSMENT / PLAN: Stable - has been off of Bystolic for > 1 year with no evidence of elevated  BP & no significant palpitations.   Problem List Items Addressed This Visit    Essential hypertension - Primary (Chronic)    Well-controlled with current readings in the 118 mmHg range - she only had one abnormal reading Patel over last year  or so. Mrs. Been with out beyond the systolic. I think for now she is doing well enough that we can simply keep her off bystolic. I think she is perfectly well suited for following up with her primary physician for just routine health care. I know her personally and have given her my card in case she has any further issues I would be happy to have her return for further evaluation.      Relevant Orders   EKG 12-Lead   History of palpitations (Chronic)    No further palpitations. With the absence of palpitations, in the future, if blood pressure control is required, would potentially consider something other than a beta blocker ( other than bystolic) or HCTZ.         No orders of the defined types were placed in this encounter.    Followup: PRN    Marykay Lex, M.D., M.S. Interventional Cardiologist    [employer] [phone] Pager # [phone]

## 2014-10-26 ENCOUNTER — Encounter: Payer: Self-pay | Admitting: Cardiology

## 2014-10-26 DIAGNOSIS — Z87898 Personal history of other specified conditions: Secondary | ICD-10-CM | POA: Insufficient documentation

## 2014-10-26 NOTE — Assessment & Plan Note (Signed)
Well-controlled with current readings in the 118 mmHg range - she only had one abnormal reading Connie Chambers over last year or so. Mrs. Been with out beyond the systolic. I think for now she is doing well enough that we can simply keep her off bystolic. I think she is perfectly well suited for following up with her primary physician for just routine health care. I know her personally and have given her my card in case she has any further issues I would be happy to have her return for further evaluation.

## 2014-10-26 NOTE — Assessment & Plan Note (Signed)
No further palpitations. With the absence of palpitations, in the future, if blood pressure control is required, would potentially consider something other than a beta blocker ( other than bystolic) or HCTZ.

## 2014-11-06 ENCOUNTER — Ambulatory Visit
Admission: RE | Admit: 2014-11-06 | Discharge: 2014-11-06 | Disposition: A | Discharge status: Home / Self Care | Payer: Managed Care, Other (non HMO) | Source: Ambulatory Visit

## 2014-11-06 DIAGNOSIS — Z1231 Encounter for screening mammogram for malignant neoplasm of breast: Secondary | ICD-10-CM

## 2014-12-25 ENCOUNTER — Encounter: Payer: Self-pay | Admitting: Gynecology

## 2014-12-25 ENCOUNTER — Ambulatory Visit (INDEPENDENT_AMBULATORY_CARE_PROVIDER_SITE_OTHER): Payer: Managed Care, Other (non HMO) | Admitting: Gynecology

## 2014-12-25 VITALS — BP 120/74 | Ht 64.0 in | Wt 123.0 lb

## 2014-12-25 DIAGNOSIS — Z01419 Encounter for gynecological examination (general) (routine) without abnormal findings: Secondary | ICD-10-CM

## 2014-12-25 DIAGNOSIS — N926 Irregular menstruation, unspecified: Secondary | ICD-10-CM | POA: Diagnosis not present

## 2014-12-25 LAB — CBC WITH DIFFERENTIAL/PLATELET
Basophils Absolute: 0 10*3/uL (ref 0.0–0.1)
Basophils Relative: 0 % (ref 0–1)
Eosinophils Absolute: 0.1 10*3/uL (ref 0.0–0.7)
Eosinophils Relative: 1 % (ref 0–5)
HEMATOCRIT: 36.3 % (ref 36.0–46.0)
HEMOGLOBIN: 12.4 g/dL (ref 12.0–15.0)
LYMPHS ABS: 2 10*3/uL (ref 0.7–4.0)
Lymphocytes Relative: 19 % (ref 12–46)
MCH: 31.2 pg (ref 26.0–34.0)
MCHC: 34.2 g/dL (ref 30.0–36.0)
MCV: 91.4 fL (ref 78.0–100.0)
MONO ABS: 0.7 10*3/uL (ref 0.1–1.0)
MONOS PCT: 7 % (ref 3–12)
MPV: 9.8 fL (ref 8.6–12.4)
Neutro Abs: 7.5 10*3/uL (ref 1.7–7.7)
Neutrophils Relative %: 73 % (ref 43–77)
Platelets: 270 10*3/uL (ref 150–400)
RBC: 3.97 MIL/uL (ref 3.87–5.11)
RDW: 13.2 % (ref 11.5–15.5)
WBC: 10.3 10*3/uL (ref 4.0–10.5)

## 2014-12-25 LAB — TSH: TSH: 1.127 u[IU]/mL (ref 0.350–4.500)

## 2014-12-25 MED ORDER — ALPRAZOLAM 0.25 MG PO TABS: ORAL_TABLET | ORAL | Status: DC

## 2014-12-25 NOTE — Progress Notes (Addendum)
Matthew SarasStephanie M Ascher Aug 31, 1965 [id number]        49 y.o.  [id number]  Patient's last menstrual period was 12/23/2014. for annual exam.  Several issues noted below.  Past medical history,surgical history, problem list, medications, allergies, family history and social history were all reviewed and documented as reviewed in the EPIC chart.  ROS:  Performed with pertinent positives and negatives included in the history, assessment and plan.   Additional significant findings :  none   Exam: [facility] personassistant Filed Vitals:   12/25/14 1138  BP: 120/74  Height: 5\' 4"  (1.626 m)  Weight: 123 lb (55.792 kg)   General appearance:  Normal affect, orientation and appearance. Skin: Grossly normal HEENT: Without gross lesions.  No cervical or supraclavicular adenopathy. Thyroid normal.  Lungs:  Clear without wheezing, rales or rhonchi Cardiac: RR, without RMG Abdominal:  Soft, nontender, without masses, guarding, rebound, organomegaly or hernia Breasts:  Examined lying and sitting without masses, retractions, discharge or axillary adenopathy. Bilateral implants noted.  Old distortion of left scar Pelvic:  Ext/BUS/vagina normal  Cervix normal  Uterus anteverted, normal size, shape and contour, midline and mobile nontender   Adnexa  Without masses or tenderness    Anus and perineum  Normal   Rectovaginal  Normal sphincter tone without palpated masses or tenderness.    Assessment/Plan:  49 y.o. [id number] female for annual exam with irregular menses, vasectomy birth control.   1. Irregular menses. Patient was having heavy bleeding last year was to schedule a sonohysterogram but never did this. Notes this year bleeding has lessened and she is having some skip in her cycles. We'll check baseline TSH FSH. Will keep menstrual calendar and as long as less frequent but regular menses will monitor. If prolonged or atypical bleeding she knows to call. 2. Pap smear/HPV negative 2015. No Pap smear done today. History  of cryosurgery in college with normal Pap smears afterwards. 3. Mammography 10/2014. Continue with annual mammography when due. SBE monthly reviewed. 4. Anxiety. Patient uses Xanax 0.25 mg intermittently. Does well with this. #30 with 3 refills provided. 5. Health maintenance. Recently had routine blood work done for [employer]. Check TSH FSH as above with CBC and urinalysis.   Dara LordsFONTAINE,Naida Escalante P MD, 12:26 PM 12/25/2014

## 2014-12-25 NOTE — Patient Instructions (Signed)

## 2014-12-26 LAB — URINALYSIS W MICROSCOPIC + REFLEX CULTURE
BACTERIA UA: NONE SEEN [HPF]
Bilirubin Urine: NEGATIVE
CASTS: NONE SEEN [LPF]
Crystals: NONE SEEN [HPF]
Glucose, UA: NEGATIVE
HGB URINE DIPSTICK: NEGATIVE
Ketones, ur: NEGATIVE
LEUKOCYTES UA: NEGATIVE
Nitrite: NEGATIVE
PROTEIN: NEGATIVE
SQUAMOUS EPITHELIAL / LPF: NONE SEEN [HPF] (ref <=5)
Specific Gravity, Urine: 1.019 (ref 1.001–1.035)
WBC UA: NONE SEEN WBC/HPF (ref <=5)
YEAST: NONE SEEN [HPF]
pH: 6.5 (ref 5.0–8.0)

## 2014-12-26 LAB — FOLLICLE STIMULATING HORMONE: FSH: 13.4 m[IU]/mL

## 2014-12-27 LAB — URINE CULTURE: Colony Count: 2000

## 2015-06-19 ENCOUNTER — Other Ambulatory Visit: Payer: Self-pay | Admitting: Gynecology

## 2015-06-22 NOTE — Telephone Encounter (Signed)
Last filled on Nov. 2016 with 3 refills

## 2015-06-22 NOTE — Telephone Encounter (Signed)
Rx called in 

## 2016-04-29 ENCOUNTER — Encounter: Payer: Self-pay | Admitting: Gynecology

## 2016-04-29 ENCOUNTER — Ambulatory Visit (INDEPENDENT_AMBULATORY_CARE_PROVIDER_SITE_OTHER): Payer: Managed Care, Other (non HMO) | Admitting: Gynecology

## 2016-04-29 VITALS — BP 122/70 | Ht 64.0 in | Wt 122.0 lb

## 2016-04-29 DIAGNOSIS — Z13220 Encounter for screening for lipoid disorders: Secondary | ICD-10-CM | POA: Diagnosis not present

## 2016-04-29 DIAGNOSIS — N926 Irregular menstruation, unspecified: Secondary | ICD-10-CM | POA: Diagnosis not present

## 2016-04-29 DIAGNOSIS — Z01419 Encounter for gynecological examination (general) (routine) without abnormal findings: Secondary | ICD-10-CM

## 2016-04-29 LAB — CBC WITH DIFFERENTIAL/PLATELET
BASOS PCT: 0 %
Basophils Absolute: 0 cells/uL (ref 0–200)
Eosinophils Absolute: 75 cells/uL (ref 15–500)
Eosinophils Relative: 1 %
HEMATOCRIT: 37.7 % (ref 35.0–45.0)
Hemoglobin: 12.5 g/dL (ref 11.7–15.5)
Lymphocytes Relative: 19 %
Lymphs Abs: 1425 cells/uL (ref 850–3900)
MCH: 30.8 pg (ref 27.0–33.0)
MCHC: 33.2 g/dL (ref 32.0–36.0)
MCV: 92.9 fL (ref 80.0–100.0)
MPV: 9.6 fL (ref 7.5–12.5)
Monocytes Absolute: 525 cells/uL (ref 200–950)
Monocytes Relative: 7 %
NEUTROS ABS: 5475 {cells}/uL (ref 1500–7800)
Neutrophils Relative %: 73 %
Platelets: 257 10*3/uL (ref 140–400)
RBC: 4.06 MIL/uL (ref 3.80–5.10)
RDW: 12.7 % (ref 11.0–15.0)
WBC: 7.5 10*3/uL (ref 3.8–10.8)

## 2016-04-29 LAB — LIPID PANEL
Cholesterol: 178 mg/dL (ref <200)
HDL: 94 mg/dL (ref >50)
LDL Cholesterol: 70 mg/dL (ref <100)
TRIGLYCERIDES: 72 mg/dL (ref <150)
Total CHOL/HDL Ratio: 1.9 Ratio (ref <5.0)
VLDL: 14 mg/dL (ref <30)

## 2016-04-29 LAB — TSH: TSH: 1.04 mIU/L

## 2016-04-29 LAB — COMPREHENSIVE METABOLIC PANEL
ALBUMIN: 4.7 g/dL (ref 3.6–5.1)
ALK PHOS: 43 U/L (ref 33–130)
ALT: 11 U/L (ref 6–29)
AST: 25 U/L (ref 10–35)
BILIRUBIN TOTAL: 0.5 mg/dL (ref 0.2–1.2)
BUN: 10 mg/dL (ref 7–25)
CALCIUM: 9 mg/dL (ref 8.6–10.4)
CO2: 26 mmol/L (ref 20–31)
Chloride: 104 mmol/L (ref 98–110)
Creat: 1.04 mg/dL (ref 0.50–1.05)
GLUCOSE: 72 mg/dL (ref 65–99)
Potassium: 4.4 mmol/L (ref 3.5–5.3)
SODIUM: 139 mmol/L (ref 135–146)
Total Protein: 7.3 g/dL (ref 6.1–8.1)

## 2016-04-29 NOTE — Patient Instructions (Signed)

## 2016-04-29 NOTE — Progress Notes (Signed)
    Matthew SarasStephanie M Lear Jun 14, 1965 [id number]        51 y.o.  [id number] for annual exam.    Past medical history,surgical history, problem list, medications, allergies, family history and social history were all reviewed and documented as reviewed in the EPIC chart.  ROS:  Performed with pertinent positives and negatives included in the history, assessment and plan.   Additional significant findings :  None   Exam: Kennon PortelaKim Gardner assistant Vitals:   04/29/16 1110  BP: 122/70  Weight: 122 lb (55.3 kg)  Height: 5\' 4"  (1.626 m)   Body mass index is 20.94 kg/m.  General appearance:  Normal affect, orientation and appearance. Skin: Grossly normal HEENT: Without gross lesions.  No cervical or supraclavicular adenopathy. Thyroid normal.  Lungs:  Clear without wheezing, rales or rhonchi Cardiac: RR, without RMG Abdominal:  Soft, nontender, without masses, guarding, rebound, organomegaly or hernia Breasts:  Examined lying and sitting without masses, retractions, discharge or axillary adenopathy.  Old distortion from left implant scar. Bilateral implants noted Pelvic:  Ext, BUS, Vagina: Normal  Cervix: Normal  Uterus: Anteverted, normal size, shape and contour, midline and mobile nontender   Adnexa: Without masses or tenderness    Anus and perineum: Normal   Rectovaginal: Normal sphincter tone without palpated masses or tenderness.    Assessment/Plan:  51 y.o. [id number] female for annual exam with irregular menses, vasectomy birth control.   1. Irregular menses. Patient continues to have some skips in her menses. Alternates between heavy and light menses. No prolonged or intermenstrual bleeding. Not having significant hot flushes or sweats. FSH last year was normal. Recheck FSH/TSH today. Will keep menstrual calendar as long as less frequent or lighter will follow. If prolonged or atypical the patient knows to follow up for further evaluation. What to expect in the perimenopausal time frame  was reviewed with the patient. 2. Pap smear/HPV 2015. No Pap smear done today. History of cryosurgery in college with no abnormal Pap smears afterwards. 3. Mammography 2016. Patient knows she is overdue and agrees to schedule. SBE monthly reviewed. 4. Colonoscopy never. Recommended patient start thinking about scheduling a screening colonoscopy yet and she has turned 50. Patient agrees to do so. 5. Anxiety. Has used Xanax intermittently in the past. Has supply but may call if she needs more. 6. Health maintenance. Patient requests routine lab work. CBC, CMP, lipid profile, TSH, FSH, vitamin D, urinalysis ordered. Follow up in one year, sooner as needed.   Dara LordsFONTAINE,Nataley Bahri P MD, 11:49 AM 04/29/2016

## 2016-04-30 LAB — URINALYSIS W MICROSCOPIC + REFLEX CULTURE
BACTERIA UA: NONE SEEN [HPF]
Bilirubin Urine: NEGATIVE
Casts: NONE SEEN [LPF]
Crystals: NONE SEEN [HPF]
Glucose, UA: NEGATIVE
Hgb urine dipstick: NEGATIVE
Ketones, ur: NEGATIVE
Nitrite: NEGATIVE
Specific Gravity, Urine: 1.026 (ref 1.001–1.035)
YEAST: NONE SEEN [HPF]
pH: 6 (ref 5.0–8.0)

## 2016-04-30 LAB — FOLLICLE STIMULATING HORMONE: FSH: 71.5 m[IU]/mL

## 2016-04-30 LAB — VITAMIN D 25 HYDROXY (VIT D DEFICIENCY, FRACTURES): VIT D 25 HYDROXY: 38 ng/mL (ref 30–100)

## 2016-05-01 LAB — URINE CULTURE

## 2016-06-01 ENCOUNTER — Other Ambulatory Visit: Payer: Self-pay | Admitting: Gynecology

## 2016-06-01 DIAGNOSIS — Z1231 Encounter for screening mammogram for malignant neoplasm of breast: Secondary | ICD-10-CM

## 2016-06-28 ENCOUNTER — Other Ambulatory Visit: Payer: Self-pay | Admitting: Gynecology

## 2016-06-29 ENCOUNTER — Ambulatory Visit
Admission: RE | Admit: 2016-06-29 | Discharge: 2016-06-29 | Disposition: A | Discharge status: Home / Self Care | Payer: Managed Care, Other (non HMO) | Source: Ambulatory Visit | Attending: Gynecology | Admitting: Gynecology

## 2016-06-29 DIAGNOSIS — Z1231 Encounter for screening mammogram for malignant neoplasm of breast: Secondary | ICD-10-CM

## 2016-07-14 ENCOUNTER — Other Ambulatory Visit: Payer: Self-pay | Admitting: Gynecology

## 2016-07-14 NOTE — Telephone Encounter (Signed)
Called into pharmacy

## 2018-02-28 ENCOUNTER — Encounter: Payer: Self-pay | Admitting: Gynecology

## 2018-02-28 ENCOUNTER — Ambulatory Visit (INDEPENDENT_AMBULATORY_CARE_PROVIDER_SITE_OTHER): Payer: 59 | Admitting: Gynecology

## 2018-02-28 VITALS — BP 130/78 | Ht 64.0 in | Wt 125.0 lb

## 2018-02-28 DIAGNOSIS — Z13220 Encounter for screening for lipoid disorders: Secondary | ICD-10-CM

## 2018-02-28 DIAGNOSIS — Z1151 Encounter for screening for human papillomavirus (HPV): Secondary | ICD-10-CM

## 2018-02-28 DIAGNOSIS — N951 Menopausal and female climacteric states: Secondary | ICD-10-CM | POA: Diagnosis not present

## 2018-02-28 DIAGNOSIS — Z01419 Encounter for gynecological examination (general) (routine) without abnormal findings: Secondary | ICD-10-CM

## 2018-02-28 MED ORDER — ALPRAZOLAM 0.25 MG PO TABS: 0.2500 mg | ORAL_TABLET | Freq: Every day | ORAL | 3 refills | Status: DC

## 2018-02-28 NOTE — Addendum Note (Signed)
Addended by: Dayna BarkerGARDNER, KIMBERLY K on: 02/28/2018 12:31 PM   Modules accepted: Orders

## 2018-02-28 NOTE — Patient Instructions (Signed)
Schedule your mammogram and your colonoscopy.  Follow-up in 1 year for annual exam.

## 2018-02-28 NOTE — Progress Notes (Signed)
    Connie Chambers Aug 04, 1965 [id number]        53 y.o.  [id number] for annual gynecologic exam.  Doing well without complaints.  Has not had a period for about 9 months.  Initially had significant hot flushes and sweats but these have resolved.  Past medical history,surgical history, problem list, medications, allergies, family history and social history were all reviewed and documented as reviewed in the EPIC chart.  ROS:  Performed with pertinent positives and negatives included in the history, assessment and plan.   Additional significant findings : None   Exam: Kennon Portela assistant Vitals:   02/28/18 1137  BP: 130/78  Weight: 125 lb (56.7 kg)  Height: 5\' 4"  (1.626 m)   Body mass index is 21.46 kg/m.  General appearance:  Normal affect, orientation and appearance. Skin: Grossly normal HEENT: Without gross lesions.  No cervical or supraclavicular adenopathy. Thyroid normal.  Lungs:  Clear without wheezing, rales or rhonchi Cardiac: RR, without RMG Abdominal:  Soft, nontender, without masses, guarding, rebound, organomegaly or hernia Breasts:  Examined lying and sitting.  Right with implant, without masses, retractions, discharge or axillary adenopathy.  Left with old distortion from implant.  No masses, discharge or axillary adenopathy. Pelvic:  Ext, BUS, Vagina: Normal  Cervix: Normal.  Pap smear/HPV  Uterus: Anteverted, normal size, shape and contour, midline and mobile nontender   Adnexa: Without masses or tenderness    Anus and perineum: Normal   Rectovaginal: Normal sphincter tone without palpated masses or tenderness.    Assessment/Plan:  53 y.o. [id number] female for annual gynecologic exam.  1. Perimenopausal.  No bleeding approaching 1 year.  Having had significant menopausal symptoms previously but these seem to have resolved.  We discussed HRT and the indications.  At this point the patient is not interested.  We will monitor and as long she continues well then  will follow expectantly.  Will call if any bleeding.  Will check baseline TSH. 2. Pap smear/HPV 2015.  Pap smear/HPV today.  History of cryosurgery in college with no abnormal Pap smears since. 3. Colonoscopy never.  Recommended scheduling screening colonoscopy and she acknowledges the recommendation. 4. Mammography 2018.  Reminded patient she is overdue and she agrees to call and schedule.  Breast exam normal today noting old distortion from implant on left unchanged over years time. 5. Anxiety.  Uses Xanax to help with sleep at times.  Number 30 0.25 mg with 3 refills provided. 6. Health maintenance.  Baseline CBC, CMP, lipid profile and TSH ordered.  Follow-up 1 year, sooner as needed.   Dara Lords MD, 12:20 PM 02/28/2018

## 2018-03-01 ENCOUNTER — Other Ambulatory Visit: Payer: Self-pay | Admitting: Gynecology

## 2018-03-01 DIAGNOSIS — R7989 Other specified abnormal findings of blood chemistry: Secondary | ICD-10-CM

## 2018-03-01 DIAGNOSIS — R7309 Other abnormal glucose: Secondary | ICD-10-CM

## 2018-03-01 LAB — LIPID PANEL
CHOLESTEROL: 195 mg/dL (ref <200)
HDL: 78 mg/dL (ref >50)
LDL Cholesterol (Calc): 103 mg/dL (calc) — ABNORMAL HIGH
NON-HDL CHOLESTEROL (CALC): 117 mg/dL (ref <130)
TRIGLYCERIDES: 59 mg/dL (ref <150)
Total CHOL/HDL Ratio: 2.5 (calc) (ref <5.0)

## 2018-03-01 LAB — PAP IG AND HPV HIGH-RISK: HPV DNA High Risk: NOT DETECTED

## 2018-03-01 LAB — CBC WITH DIFFERENTIAL/PLATELET
Absolute Monocytes: 251 cells/uL (ref 200–950)
BASOS ABS: 33 {cells}/uL (ref 0–200)
Basophils Relative: 0.5 %
EOS ABS: 79 {cells}/uL (ref 15–500)
Eosinophils Relative: 1.2 %
HCT: 38.8 % (ref 35.0–45.0)
HEMOGLOBIN: 13.6 g/dL (ref 11.7–15.5)
Lymphs Abs: 1505 cells/uL (ref 850–3900)
MCH: 31.1 pg (ref 27.0–33.0)
MCHC: 35.1 g/dL (ref 32.0–36.0)
MCV: 88.8 fL (ref 80.0–100.0)
MONOS PCT: 3.8 %
MPV: 9.8 fL (ref 7.5–12.5)
NEUTROS PCT: 71.7 %
Neutro Abs: 4732 cells/uL (ref 1500–7800)
Platelets: 349 10*3/uL (ref 140–400)
RBC: 4.37 10*6/uL (ref 3.80–5.10)
RDW: 11.8 % (ref 11.0–15.0)
Total Lymphocyte: 22.8 %
WBC: 6.6 10*3/uL (ref 3.8–10.8)

## 2018-03-01 LAB — COMPREHENSIVE METABOLIC PANEL
AG RATIO: 1.5 (calc) (ref 1.0–2.5)
ALKALINE PHOSPHATASE (APISO): 56 U/L (ref 33–130)
ALT: 13 U/L (ref 6–29)
AST: 23 U/L (ref 10–35)
Albumin: 4.5 g/dL (ref 3.6–5.1)
BUN / CREAT RATIO: 12 (calc) (ref 6–22)
BUN: 13 mg/dL (ref 7–25)
CALCIUM: 9.4 mg/dL (ref 8.6–10.4)
CO2: 27 mmol/L (ref 20–32)
Chloride: 101 mmol/L (ref 98–110)
Creat: 1.12 mg/dL — ABNORMAL HIGH (ref 0.50–1.05)
GLOBULIN: 3 g/dL (ref 1.9–3.7)
GLUCOSE: 117 mg/dL — AB (ref 65–99)
Potassium: 4.1 mmol/L (ref 3.5–5.3)
Sodium: 137 mmol/L (ref 135–146)
Total Bilirubin: 0.6 mg/dL (ref 0.2–1.2)
Total Protein: 7.5 g/dL (ref 6.1–8.1)

## 2018-03-01 LAB — TSH: TSH: 0.85 m[IU]/L

## 2018-03-23 ENCOUNTER — Other Ambulatory Visit: Payer: Self-pay | Admitting: Gynecology

## 2018-03-23 DIAGNOSIS — Z1231 Encounter for screening mammogram for malignant neoplasm of breast: Secondary | ICD-10-CM

## 2018-04-25 ENCOUNTER — Ambulatory Visit: Payer: Managed Care, Other (non HMO)

## 2018-05-01 ENCOUNTER — Ambulatory Visit: Payer: Managed Care, Other (non HMO)

## 2018-05-28 ENCOUNTER — Ambulatory Visit: Payer: Managed Care, Other (non HMO)

## 2018-07-10 ENCOUNTER — Ambulatory Visit: Payer: Managed Care, Other (non HMO)

## 2018-07-18 ENCOUNTER — Ambulatory Visit: Payer: Managed Care, Other (non HMO)

## 2018-08-01 ENCOUNTER — Ambulatory Visit: Payer: Self-pay

## 2018-08-29 ENCOUNTER — Other Ambulatory Visit: Payer: Self-pay | Admitting: Gynecology

## 2018-09-05 ENCOUNTER — Other Ambulatory Visit: Payer: Self-pay | Admitting: Gynecology

## 2018-09-06 NOTE — Telephone Encounter (Signed)
Dr. Loetta Rough refilled this on 08/29/18 but it never reached the pharmacy. I called it in today.

## 2018-09-17 ENCOUNTER — Other Ambulatory Visit: Payer: Self-pay

## 2018-09-17 ENCOUNTER — Ambulatory Visit
Admission: RE | Admit: 2018-09-17 | Discharge: 2018-09-17 | Disposition: A | Discharge status: Home / Self Care | Payer: Self-pay | Source: Ambulatory Visit | Attending: Gynecology | Admitting: Gynecology

## 2018-09-17 DIAGNOSIS — Z1231 Encounter for screening mammogram for malignant neoplasm of breast: Secondary | ICD-10-CM

## 2018-10-25 ENCOUNTER — Other Ambulatory Visit: Payer: Self-pay

## 2018-10-25 MED ORDER — ALPRAZOLAM 0.25 MG PO TABS: 0.2500 mg | ORAL_TABLET | Freq: Every day | ORAL | 1 refills | Status: DC

## 2018-10-25 NOTE — Telephone Encounter (Signed)
Requesting refill on Xanax °

## 2018-10-31 ENCOUNTER — Encounter: Payer: Self-pay | Admitting: Gynecology

## 2019-01-18 ENCOUNTER — Encounter: Payer: 59 | Admitting: Gynecology

## 2019-03-04 ENCOUNTER — Encounter: Payer: 59 | Admitting: Obstetrics and Gynecology

## 2019-03-15 ENCOUNTER — Other Ambulatory Visit: Payer: Self-pay

## 2019-03-18 ENCOUNTER — Encounter: Payer: Self-pay | Admitting: Obstetrics and Gynecology

## 2019-03-18 ENCOUNTER — Other Ambulatory Visit: Payer: Self-pay

## 2019-03-18 ENCOUNTER — Ambulatory Visit (INDEPENDENT_AMBULATORY_CARE_PROVIDER_SITE_OTHER): Payer: Managed Care, Other (non HMO) | Admitting: Obstetrics and Gynecology

## 2019-03-18 VITALS — BP 118/76 | Ht 65.0 in | Wt 121.0 lb

## 2019-03-18 DIAGNOSIS — Z01419 Encounter for gynecological examination (general) (routine) without abnormal findings: Secondary | ICD-10-CM | POA: Diagnosis not present

## 2019-03-18 DIAGNOSIS — Z13220 Encounter for screening for lipoid disorders: Secondary | ICD-10-CM

## 2019-03-18 DIAGNOSIS — R7989 Other specified abnormal findings of blood chemistry: Secondary | ICD-10-CM | POA: Diagnosis not present

## 2019-03-18 NOTE — Progress Notes (Signed)
   AUBRINA NIEMAN Dec 15, 1965 [id number]  SUBJECTIVE:  54 y.o. A6T0160 female for annual routine gynecologic exam. Minor hot flashes that are manageable, now menopausal with last menses at least 1 year ago.  She has no gynecologic concerns.  Current Outpatient Medications  Medication Sig Dispense Refill  . ALPRAZolam (XANAX) 0.25 MG tablet Take 1 tablet (0.25 mg total) by mouth at bedtime. 30 tablet 1   No current facility-administered medications for this visit.   Allergies: Patient has no known allergies.  No LMP recorded (lmp unknown). Patient is postmenopausal.  Past medical history,surgical history, problem list, medications, allergies, family history and social history were all reviewed and documented as reviewed in the EPIC chart.  ROS:  Feeling well. No dyspnea or chest pain on exertion.  No abdominal pain, change in bowel habits, black or bloody stools.  No urinary tract symptoms. GYN ROS: no abnormal bleeding, pelvic pain or discharge, no breast pain or new or enlarging lumps on self exam. No neurological complaints.    OBJECTIVE:  BP 118/76   Ht 5\' 5"  (1.651 m)   Wt 121 lb (54.9 kg)   LMP  (LMP Unknown)   BMI 20.14 kg/m  The patient appears well, alert, oriented x 3, in no distress. ENT normal.  Neck supple. No cervical or supraclavicular adenopathy or thyromegaly.  Lungs are clear, good air entry, no wheezes, rhonchi or rales. S1 and S2 normal, no murmurs, regular rate and rhythm.  Abdomen soft without tenderness, guarding, mass or organomegaly.  Neurological is normal, no focal findings.  BREAST EXAM: breasts appear normal, no suspicious masses, no skin or nipple changes or axillary nodes.  Left breast with slight distortion from prior implant.  PELVIC EXAM: VULVA: normal appearing vulva with no masses, tenderness or lesions, VAGINA: normal appearing vagina with normal color and discharge, no lesions, CERVIX: normal appearing cervix without discharge or lesions,  UTERUS: uterus is normal size, shape, consistency and nontender, ADNEXA: normal adnexa in size, nontender and no masses  Chaperone: present during the examination  ASSESSMENT:  54 y.o. 40 here for annual gynecologic exam  PLAN:   1. Postmenopausal.  Minor hot flashes and vaginal dryness, manageable.  Lubricant as needed, let [id number] know if any developing concerns. 2. Pap smear /HPV 02/2018. Not repeated today. History of cryosurgery in college with no abnormal Pap smears since. Next Pap smear due 2025 following the current screening guidelines calling for the 5-year interval. 3. Mammogram 09/2018. Will continue with annual mammography. Breast exam normal today. 4. Colonoscopy. Recommended that she pursue this done this year, we can provide contact information to facilitate this. 5. DEXA when further into menopause. 6. Health maintenance.  She will proceed to lab today for CBC, CMP, lipid profile, TSH, and vitamin D. Only celery juice this AM.  Will compare to elevated creatinine last year on labs.  Return annually or sooner, prn.  10/2018 MD  03/18/19

## 2019-03-18 NOTE — Patient Instructions (Addendum)
We will check routine screening labs today A colonoscopy is recommended for colon cancer screening at age 54, please consider scheduling this Please remember to schedule your annual mammogram this year.

## 2019-03-21 LAB — COMPREHENSIVE METABOLIC PANEL
AG Ratio: 1.7 (calc) (ref 1.0–2.5)
ALT: 14 U/L (ref 6–29)
AST: 26 U/L (ref 10–35)
Albumin: 5.1 g/dL (ref 3.6–5.1)
Alkaline phosphatase (APISO): 59 U/L (ref 37–153)
BUN: 10 mg/dL (ref 7–25)
CO2: 26 mmol/L (ref 20–32)
Calcium: 9.8 mg/dL (ref 8.6–10.4)
Chloride: 105 mmol/L (ref 98–110)
Creat: 0.83 mg/dL (ref 0.50–1.05)
Globulin: 3 g/dL (calc) (ref 1.9–3.7)
Glucose, Bld: 67 mg/dL (ref 65–99)
Potassium: 4.8 mmol/L (ref 3.5–5.3)
Sodium: 140 mmol/L (ref 135–146)
Total Bilirubin: 0.3 mg/dL (ref 0.2–1.2)
Total Protein: 8.1 g/dL (ref 6.1–8.1)

## 2019-03-21 LAB — CBC
HCT: 41.8 % (ref 35.0–45.0)
Hemoglobin: 13.9 g/dL (ref 11.7–15.5)
MCH: 30.5 pg (ref 27.0–33.0)
MCHC: 33.3 g/dL (ref 32.0–36.0)
MCV: 91.9 fL (ref 80.0–100.0)
MPV: 10 fL (ref 7.5–12.5)
Platelets: 280 10*3/uL (ref 140–400)
RBC: 4.55 10*6/uL (ref 3.80–5.10)
RDW: 12 % (ref 11.0–15.0)
WBC: 5.1 10*3/uL (ref 3.8–10.8)

## 2019-03-21 LAB — LIPID PANEL
Cholesterol: 208 mg/dL — ABNORMAL HIGH (ref <200)
HDL: 88 mg/dL (ref >=50)
LDL Cholesterol (Calc): 99 mg/dL (calc)
Non-HDL Cholesterol (Calc): 120 mg/dL (calc) (ref <130)
Total CHOL/HDL Ratio: 2.4 (calc) (ref <5.0)
Triglycerides: 111 mg/dL (ref <150)

## 2019-03-21 LAB — VITAMIN D 1,25 DIHYDROXY
Vitamin D 1, 25 (OH)2 Total: 41 pg/mL (ref 18–72)
Vitamin D2 1, 25 (OH)2: <8 pg/mL
Vitamin D3 1, 25 (OH)2: 41 pg/mL

## 2019-03-21 LAB — TSH: TSH: 1.29 mIU/L

## 2020-03-02 ENCOUNTER — Other Ambulatory Visit: Payer: Self-pay | Admitting: Obstetrics and Gynecology

## 2020-03-02 DIAGNOSIS — Z1231 Encounter for screening mammogram for malignant neoplasm of breast: Secondary | ICD-10-CM

## 2020-03-20 ENCOUNTER — Ambulatory Visit: Payer: Managed Care, Other (non HMO) | Admitting: Obstetrics and Gynecology

## 2020-03-26 ENCOUNTER — Ambulatory Visit: Payer: Managed Care, Other (non HMO) | Admitting: Obstetrics and Gynecology

## 2020-03-26 ENCOUNTER — Other Ambulatory Visit: Payer: Self-pay

## 2020-03-26 ENCOUNTER — Encounter: Payer: Self-pay | Admitting: Obstetrics and Gynecology

## 2020-03-26 VITALS — BP 118/70 | HR 68 | Ht 63.75 in | Wt 126.0 lb

## 2020-03-26 DIAGNOSIS — Z01419 Encounter for gynecological examination (general) (routine) without abnormal findings: Secondary | ICD-10-CM

## 2020-03-26 DIAGNOSIS — N39498 Other specified urinary incontinence: Secondary | ICD-10-CM | POA: Diagnosis not present

## 2020-03-26 NOTE — Progress Notes (Signed)
   Connie Chambers 07-Sep-1965 [id number]  SUBJECTIVE:  55 y.o. [id number] female here for annual routine gynecologic exam.  Slight urinary leakage/stress incontinence symptoms.  She has no gynecologic concerns.   Current Outpatient Medications  Medication Sig Dispense Refill  . ALPRAZolam (XANAX) 0.25 MG tablet Take 1 tablet (0.25 mg total) by mouth at bedtime. 30 tablet 1   No current facility-administered medications for this visit.   Allergies: Patient has no known allergies.  No LMP recorded (lmp unknown). Patient is postmenopausal.  Past medical history,surgical history, problem list, medications, allergies, family history and social history were all reviewed and documented as reviewed in the EPIC chart.  ROS: Pertinent positives and negatives as reviewed in HPI   OBJECTIVE:  BP 118/70 (BP Location: Right Arm, Patient Position: Sitting, Cuff Size: Normal)   Pulse 68   Ht 5' 3.75" (1.619 m)   Wt 126 lb (57.2 kg)   LMP  (LMP Unknown)   BMI 21.80 kg/m  The patient appears well, alert, oriented, in no distress. ENT normal.  Neck supple. No cervical or supraclavicular adenopathy or thyromegaly.  Lungs are clear, good air entry, no wheezes, rhonchi or rales. S1 and S2 normal, no murmurs, regular rate and rhythm.  Abdomen soft without tenderness, guarding, mass or organomegaly.  Neurological is normal, no focal findings.  BREAST EXAM: breasts appear normal, no suspicious masses, no skin or nipple changes or axillary nodes.  Bilateral implants noted.  PELVIC EXAM: VULVA: normal appearing vulva with atrophic changes, no masses, tenderness or lesions, VAGINA: normal appearing vagina with normal color and discharge, atrophic changes, no lesions, CERVIX: normal appearing cervix without discharge or lesions, UTERUS: uterus is normal size, shape, consistency and nontender, ADNEXA: normal adnexa in size, nontender and no masses  Chaperone: Zenovia Jordan present during the  examination  ASSESSMENT:  55 y.o. P5T6144 here for annual gynecologic exam  PLAN:   1. Postmenopausal.  Minor hot flashes and vaginal dryness, manageable.  Will let us know if any developing concerns.  No vaginal bleeding. 2. Urinary incontinence.  Discussed timed voids and kegel exercises.  If any persistent bothersome symptoms we could refer for urogyn evaluation and management.   3. Pap smear/HPV 02/2018. Not repeated today. History of cryosurgery in college with no abnormal Pap smears since. Next Pap smear due 2025 following the current screening guidelines calling for the 5-year interval. 4. Mammogram 09/2018.  Next mammogram scheduled 04/2020.  Will continue with annual mammography. Breast exam normal today. 5. Colonoscopy. Recommended again that she pursue colon cancer screening.  Briefly discussed Cologuard as an alternative screening for average risk patients.  Recommend she discuss further with her primary provider. 6. DEXA recommended when further into menopause. 7. Health maintenance.  No labs today as she recently had a physical with her primary provider.  The patient is aware that I will only be at this practice until early March 2022 so she knows to make sure she requests any needed follow-up when I am no longer at the practice.  Return annually or sooner, prn.   Theresia Majors MD  03/26/20

## 2020-04-14 ENCOUNTER — Ambulatory Visit
Admission: RE | Admit: 2020-04-14 | Discharge: 2020-04-14 | Disposition: A | Discharge status: Home / Self Care | Payer: Self-pay | Source: Ambulatory Visit | Attending: Obstetrics and Gynecology | Admitting: Obstetrics and Gynecology

## 2020-04-14 ENCOUNTER — Other Ambulatory Visit: Payer: Self-pay

## 2020-04-14 DIAGNOSIS — Z1231 Encounter for screening mammogram for malignant neoplasm of breast: Secondary | ICD-10-CM

## 2020-04-20 ENCOUNTER — Other Ambulatory Visit: Payer: Self-pay | Admitting: *Deleted

## 2020-04-20 MED ORDER — ALPRAZOLAM 0.25 MG PO TABS: 0.2500 mg | ORAL_TABLET | Freq: Every day | ORAL | 0 refills | Status: DC

## 2020-04-20 NOTE — Telephone Encounter (Addendum)
(  patient saw Dr.Kendall) Patient called requesting refill on Xanax 0.25 mg tablet, last filled on 10/28/18 #30 1 refill Rx expired.  Per note on 02/2018 "Anxiety.  Uses Xanax to help with sleep at times.   Please advise

## 2020-05-25 ENCOUNTER — Ambulatory Visit: Payer: 59 | Admitting: Psychology

## 2021-03-30 ENCOUNTER — Other Ambulatory Visit: Payer: Self-pay | Admitting: Nurse Practitioner

## 2021-08-07 IMAGING — MG DIGITAL SCREENING BREAST BILAT IMPLANT W/ TOMO W/ CAD
8 of 12 series · 8 of 28 positions shown · non-contrast
Comparison: Previous exam(s).

CLINICAL DATA: Screening.

EXAM:
DIGITAL SCREENING BILATERAL MAMMOGRAM WITH IMPLANTS, CAD AND
TOMOSYNTHESIS
TECHNIQUE: Bilateral screening digital craniocaudal and mediolateral oblique
mammograms were obtained. Bilateral screening digital breast
tomosynthesis was performed. The images were evaluated with
computer-aided detection. Standard and/or implant displaced views
were performed.

[L CC]
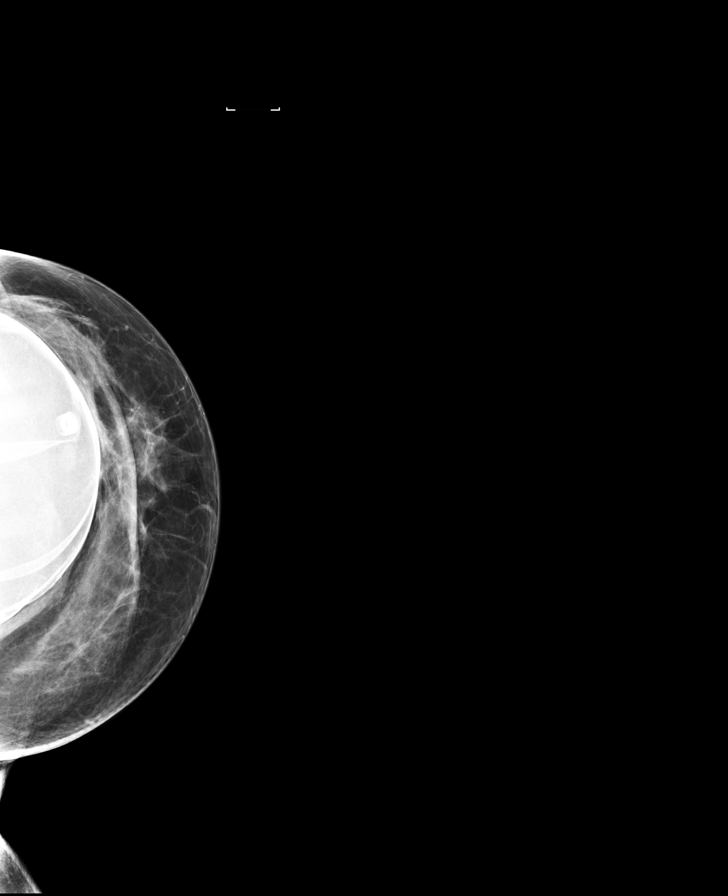

[R CC]
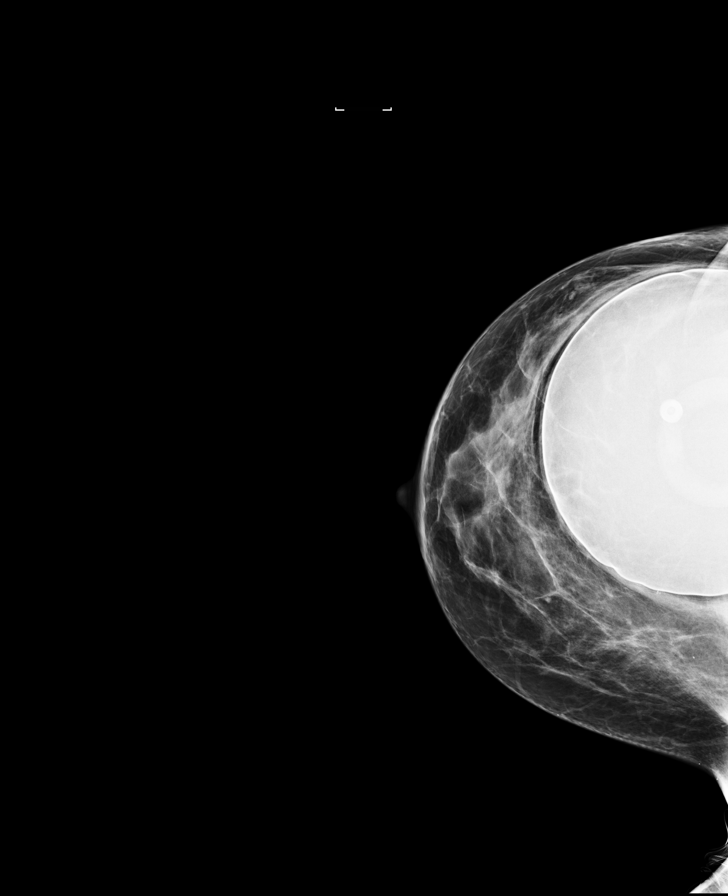

[R MLO]
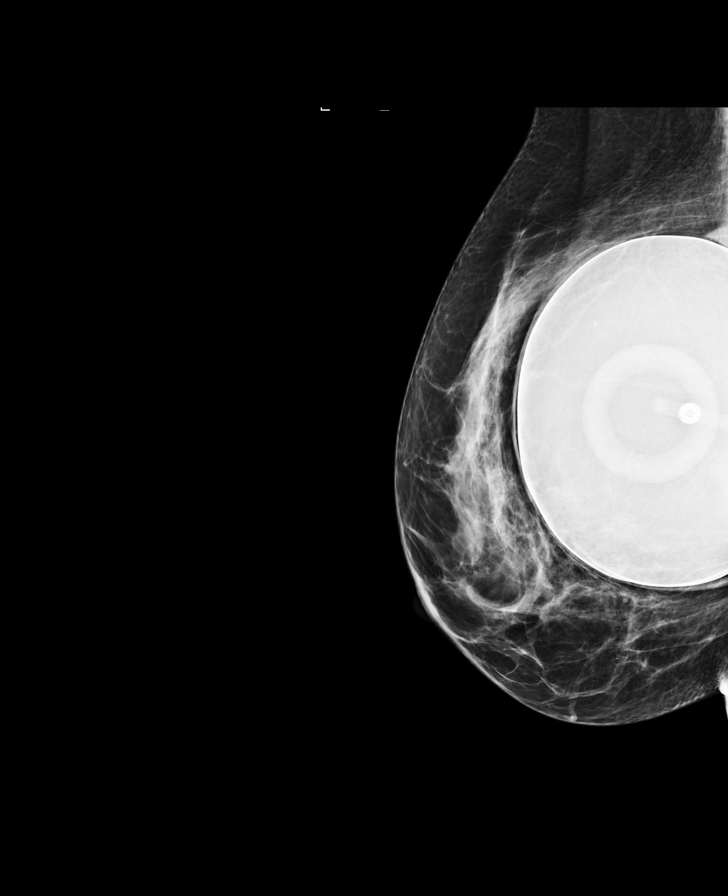

[L MLO]
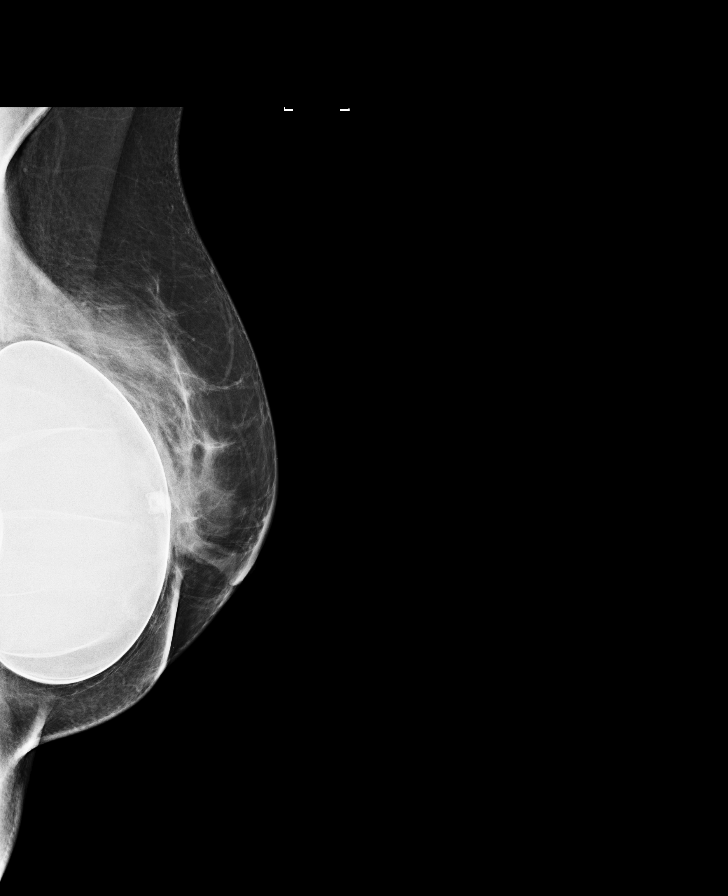

[L CC synth-2D]
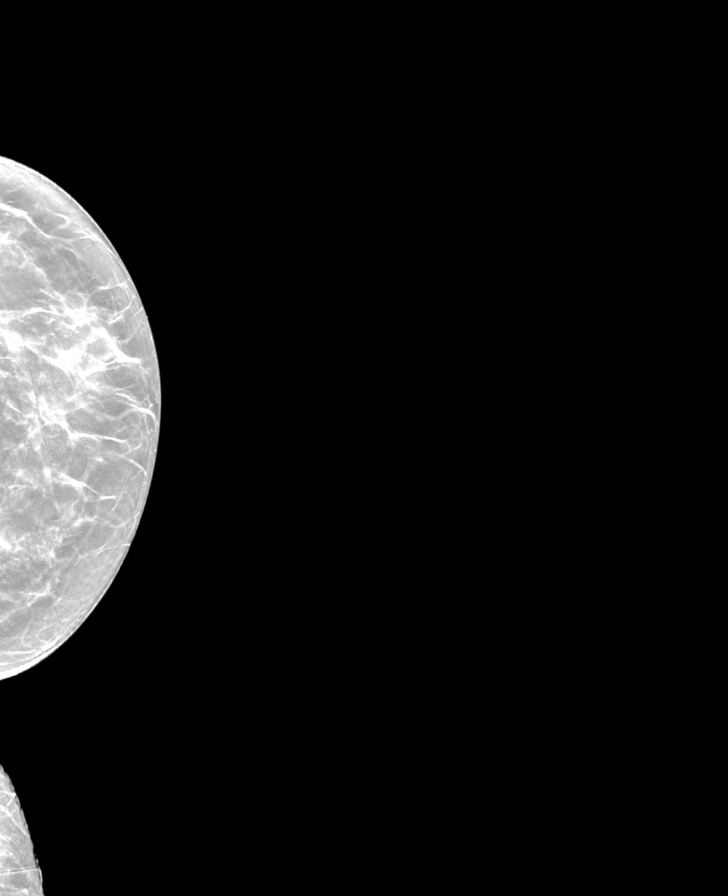

[R CC synth-2D]
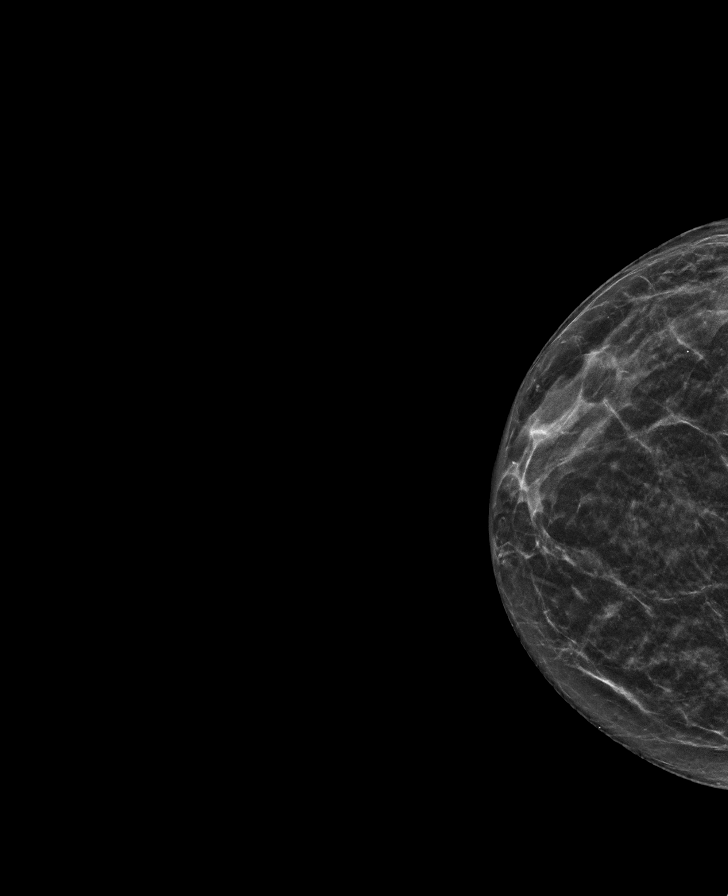

[R MLO synth-2D]
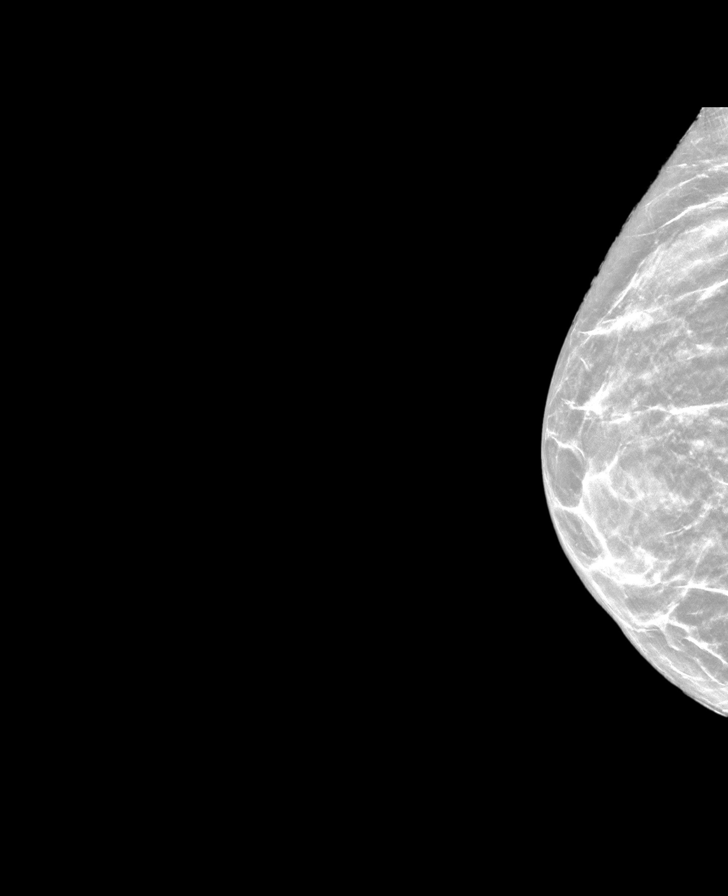

[L MLO synth-2D]
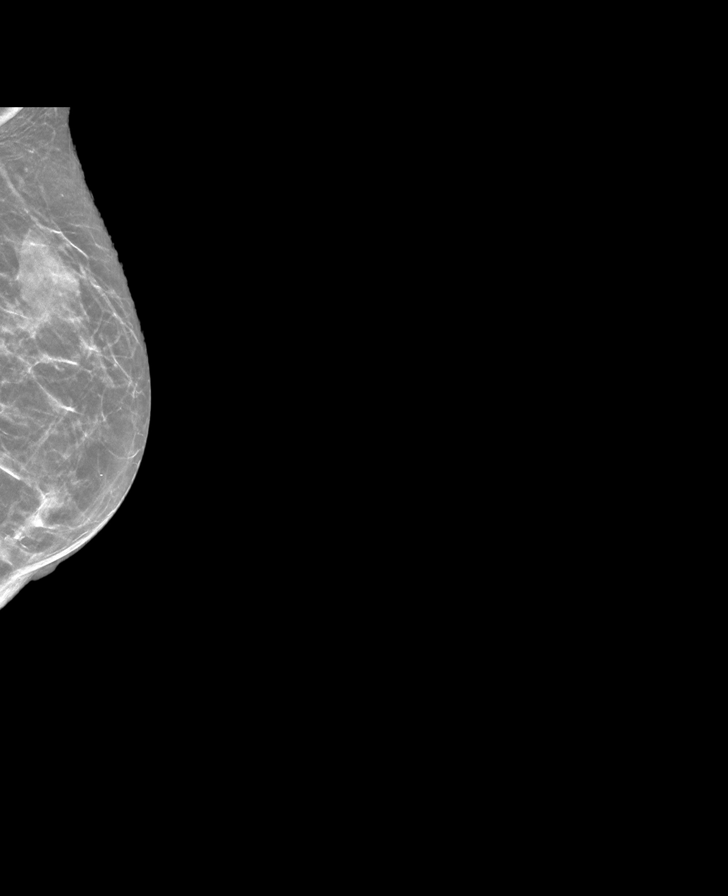

[8 of 28 positions shown; findings below may reference images not displayed]

ACR Breast Density Category b: There are scattered areas of
fibroglandular density.
FINDINGS: The patient has retropectoral saline implants. There are no findings
suspicious for malignancy.
IMPRESSION: No mammographic evidence of malignancy. A result letter of this
screening mammogram will be mailed directly to the patient.

RECOMMENDATION:
Screening mammogram in one year. (Code:NC-6-TAT)

BI-RADS CATEGORY  1:  Negative.

## 2022-06-29 ENCOUNTER — Other Ambulatory Visit: Payer: Self-pay | Admitting: Family Medicine

## 2022-06-29 DIAGNOSIS — Z1231 Encounter for screening mammogram for malignant neoplasm of breast: Secondary | ICD-10-CM

## 2022-07-20 ENCOUNTER — Ambulatory Visit
Admission: RE | Admit: 2022-07-20 | Discharge: 2022-07-20 | Disposition: A | Discharge status: Home / Self Care | Payer: No Typology Code available for payment source | Source: Ambulatory Visit | Attending: Family Medicine | Admitting: Family Medicine

## 2022-07-20 DIAGNOSIS — Z1231 Encounter for screening mammogram for malignant neoplasm of breast: Secondary | ICD-10-CM

## 2022-07-27 ENCOUNTER — Other Ambulatory Visit: Payer: Self-pay

## 2022-09-21 ENCOUNTER — Encounter: Payer: Self-pay | Admitting: Radiology

## 2022-09-21 ENCOUNTER — Ambulatory Visit (INDEPENDENT_AMBULATORY_CARE_PROVIDER_SITE_OTHER): Payer: No Typology Code available for payment source | Admitting: Radiology

## 2022-09-21 VITALS — BP 132/78 | Ht 63.5 in | Wt 125.0 lb

## 2022-09-21 DIAGNOSIS — Z01419 Encounter for gynecological examination (general) (routine) without abnormal findings: Secondary | ICD-10-CM | POA: Diagnosis not present

## 2022-09-21 DIAGNOSIS — Z1211 Encounter for screening for malignant neoplasm of colon: Secondary | ICD-10-CM

## 2022-09-21 DIAGNOSIS — Z13820 Encounter for screening for osteoporosis: Secondary | ICD-10-CM

## 2022-09-21 NOTE — Progress Notes (Signed)
   Connie Chambers 1965-02-24 [id number]   History: Postmenopausal 57 y.o. presents for annual exam. Concerned re: 1" height loss. Otherwise doing well, no gyn concerns. Has occasional vaginal dryness with intercourse.   Gynecologic History Postmenopausal Last Pap: 2020. Results were: normal Last mammogram: 2024. Results were: normal Last colonoscopy: never DEXA:never   Obstetric History OB History  Gravida Para Term Preterm AB Living  4 2 2   2 2   SAB IAB Ectopic Multiple Live Births               # Outcome Date GA Lbr Len/2nd Weight Sex Type Anes PTL Lv  4 AB           3 AB           2 Term           1 Term              The following portions of the patient's history were reviewed and updated as appropriate: allergies, current medications, past family history, past medical history, past social history, past surgical history, and problem list.  Review of Systems Pertinent items noted in HPI and remainder of comprehensive ROS otherwise negative.  Past medical history, past surgical history, family history and social history were all reviewed and documented in the EPIC chart.  Exam:  Vitals:   09/21/22 1101  BP: 132/78  Weight: 125 lb (56.7 kg)  Height: 5' 3.5" (1.613 m)   Body mass index is 21.8 kg/m.  General appearance:  Normal Thyroid:  Symmetrical, normal in size, without palpable masses or nodularity. Respiratory  Auscultation:  Clear without wheezing or rhonchi Cardiovascular  Auscultation:  Regular rate, without rubs, murmurs or gallops  Edema/varicosities:  Not grossly evident Abdominal  Soft,nontender, without masses, guarding or rebound.  Liver/spleen:  No organomegaly noted  Hernia:  None appreciated  Skin  Inspection:  Grossly normal Breasts: Examined lying and sitting.   Right: Without masses, retractions, nipple discharge or axillary adenopathy.   Left: Without masses, retractions, nipple discharge or axillary adenopathy. Genitourinary    Inguinal/mons:  Normal without inguinal adenopathy  External genitalia:  Normal appearing vulva with no masses, tenderness, or lesions  BUS/Urethra/Skene's glands:  Normal  Vagina:  Normal appearing with normal color and discharge, no lesions. Atrophy: mild   Cervix:  Normal appearing without discharge or lesions  Uterus:  Normal in size, shape and contour.  Midline and mobile, nontender  Adnexa/parametria:     Rt: Normal in size, without masses or tenderness.   Lt: Normal in size, without masses or tenderness.  Anus and perineum: Normal    Raynelle Fanning, CMA present for exam  Assessment/Plan:   1. Well woman exam with routine gynecological exam Pap due 2025  2. Screening for osteoporosis Vit D3 5000iu with K2 nightly - DG Bone Density; Future  3. Screen for colon cancer - Cologuard   Discussed SBE, colonoscopy and DEXA screening as directed. Recommend of exercise weekly, including weight bearing exercise. Encouraged the use of seatbelts and sunscreen.  Return in 1 year for annual or sooner prn.  Arlie Solomons B WHNP-BC, 11:21 AM 09/21/2022

## 2022-11-12 LAB — COLOGUARD: COLOGUARD: NEGATIVE

## 2023-06-01 ENCOUNTER — Telehealth: Payer: Self-pay

## 2023-06-01 NOTE — Telephone Encounter (Signed)
 Pt LVM in triage line stating that she has been receiving estrogen and progesterone from an online service. However, wants us  to take over this for her.   Per EMR investigation: HTN in her dx list, no current meds on her list.   Due for AEX in 09/2023.  Would she need an OV to discuss/check vitals?  Please advise. Thanks.

## 2023-06-01 NOTE — Telephone Encounter (Signed)
 Yes, need OV. Bring current meds she is on.

## 2023-06-01 NOTE — Telephone Encounter (Signed)
 Spoke w/ the pt and she reported going through a company called [employer]. Had BW done at [facility] for them. Is happy to share results. (Per EMR investigation: nothing recent seen under [facility] tab).  3 things that were recommended and she is using/taking now:   Progesterone 100mg  orally every day Estradiol 0.05mg  patch twice weekly NAD Plus nasal spray.   Pt notified and voiced understanding that OV is still needed. Accepted appt for 4/25 @ 0830 (arrival at 0815).   Routing to provider for final review and encounter closed.

## 2023-06-02 ENCOUNTER — Encounter: Payer: Self-pay | Admitting: Radiology

## 2023-06-02 ENCOUNTER — Ambulatory Visit (INDEPENDENT_AMBULATORY_CARE_PROVIDER_SITE_OTHER): Admitting: Radiology

## 2023-06-02 VITALS — BP 100/70 | HR 76 | Wt 128.6 lb

## 2023-06-02 DIAGNOSIS — R29890 Loss of height: Secondary | ICD-10-CM

## 2023-06-02 DIAGNOSIS — Z79890 Hormone replacement therapy (postmenopausal): Secondary | ICD-10-CM | POA: Diagnosis not present

## 2023-06-02 DIAGNOSIS — Z13820 Encounter for screening for osteoporosis: Secondary | ICD-10-CM

## 2023-06-02 NOTE — Progress Notes (Signed)
   Connie Chambers 09-28-1965 [id number]   History:  58 y.o. here to discuss HRT options. Currently using estradiol 0.05mg  patch, progesterone 100mg , NAD+ 300mg  nasal spray from an online provider.  Gynecologic History No LMP recorded (lmp unknown). Patient is postmenopausal.    Last Pap: 2020. Results were: normal Last mammogram: 07/2022. Results were: normal  Obstetric History OB History  Gravida Para Term Preterm AB Living  4 2 2  2 2   SAB IAB Ectopic Multiple Live Births          # Outcome Date GA Lbr Len/2nd Weight Sex Type Anes PTL Lv  4 AB           3 AB           2 Term           1 Term                The following portions of the patient's history were reviewed and updated as appropriate: allergies, current medications, past family history, past medical history, past social history, past surgical history, and problem list.  Review of Systems  All other systems reviewed and are negative.   Past medical history, past surgical history, family history and social history were all reviewed and documented in the EPIC chart.  Exam:  Vitals:   06/02/23 0823  BP: 100/70  Pulse: 76  SpO2: 96%  Weight: 128 lb 9.6 oz (58.3 kg)   Body mass index is 22.42 kg/m.  Physical Exam Vitals and nursing note reviewed.  Constitutional:      Appearance: Normal appearance. She is normal weight.  Pulmonary:     Effort: Pulmonary effort is normal.  Neurological:     Mental Status: She is alert.  Psychiatric:        Mood and Affect: Mood normal.        Thought Content: Thought content normal.        Judgment: Judgment normal.      Assessment/Plan:   1. Hormone replacement therapy (HRT) (Primary) Will continue current doses until bone density- will change based on results Patient agreeable to plan. NAD+ spray not FDA approved, no significant data to continue for benefit  2. Screening for osteoporosis - DG Bone Density; Future  3. Height loss - DG Bone Density;  Future     Synetta Eves B WHNP-BC 9:12 AM 06/02/2023

## 2023-06-08 ENCOUNTER — Ambulatory Visit (HOSPITAL_BASED_OUTPATIENT_CLINIC_OR_DEPARTMENT_OTHER)
Admission: RE | Admit: 2023-06-08 | Discharge: 2023-06-08 | Disposition: A | Discharge status: Home / Self Care | Source: Ambulatory Visit | Attending: Radiology | Admitting: Radiology

## 2023-06-08 DIAGNOSIS — R29890 Loss of height: Secondary | ICD-10-CM | POA: Insufficient documentation

## 2023-06-08 DIAGNOSIS — Z13820 Encounter for screening for osteoporosis: Secondary | ICD-10-CM | POA: Insufficient documentation

## 2023-06-15 ENCOUNTER — Other Ambulatory Visit: Payer: Self-pay

## 2023-06-15 MED ORDER — PROGESTERONE MICRONIZED 100 MG PO CAPS: 100.0000 mg | ORAL_CAPSULE | Freq: Every day | ORAL | 3 refills | Status: DC

## 2023-06-15 MED ORDER — ESTRADIOL 0.05 MG/24HR TD PTTW: 1.0000 | MEDICATED_PATCH | TRANSDERMAL | 3 refills | Status: DC

## 2023-06-15 NOTE — Telephone Encounter (Signed)
 Patient called and requested HRT's be sent to [facility]. She states that at her visit you were waiting on bone density results. Please advise.

## 2023-09-28 ENCOUNTER — Encounter: Payer: Self-pay | Admitting: Radiology

## 2023-09-28 ENCOUNTER — Ambulatory Visit (INDEPENDENT_AMBULATORY_CARE_PROVIDER_SITE_OTHER): Admitting: Radiology

## 2023-09-28 ENCOUNTER — Other Ambulatory Visit (HOSPITAL_COMMUNITY)
Admission: RE | Admit: 2023-09-28 | Discharge: 2023-09-28 | Disposition: A | Discharge status: Home / Self Care | Source: Ambulatory Visit | Attending: Radiology | Admitting: Radiology

## 2023-09-28 VITALS — BP 102/64 | HR 70 | Ht 64.5 in | Wt 128.0 lb

## 2023-09-28 DIAGNOSIS — M8580 Other specified disorders of bone density and structure, unspecified site: Secondary | ICD-10-CM | POA: Diagnosis not present

## 2023-09-28 DIAGNOSIS — Z01419 Encounter for gynecological examination (general) (routine) without abnormal findings: Secondary | ICD-10-CM

## 2023-09-28 DIAGNOSIS — Z1331 Encounter for screening for depression: Secondary | ICD-10-CM

## 2023-09-28 DIAGNOSIS — Z79890 Hormone replacement therapy (postmenopausal): Secondary | ICD-10-CM

## 2023-09-28 DIAGNOSIS — Z780 Asymptomatic menopausal state: Secondary | ICD-10-CM

## 2023-09-28 MED ORDER — ESTRADIOL 0.05 MG/24HR TD PTTW
1.0000 | MEDICATED_PATCH | TRANSDERMAL | 4 refills | Status: AC
Start: 2023-09-28 — End: ?

## 2023-09-28 MED ORDER — PROGESTERONE MICRONIZED 100 MG PO CAPS: 100.0000 mg | ORAL_CAPSULE | Freq: Every day | ORAL | 4 refills | Status: AC

## 2023-09-28 NOTE — Progress Notes (Signed)
 Connie Chambers March 19, 1965 [id number]   History: Postmenopausal 58 y.o. presents for annual exam. Doing well on HRT. C/o weight gain around the middle. Does hot yoga multiple times a week. Separated from husband. No new partners.   Gynecologic History Postmenopausal Last Pap: 2020. Results were: normal Last mammogram: 07/2022. Results were: normal Last colonoscopy: cologuard 9/24 DEXA:osteopenia 06/2022  Obstetric History OB History  Gravida Para Term Preterm AB Living  4 2 2  2 2   SAB IAB Ectopic Multiple Live Births          # Outcome Date GA Lbr Len/2nd Weight Sex Type Anes PTL Lv  4 AB           3 AB           2 Term           1 Term                09/28/2023    9:13 AM  Depression screen PHQ 2/9  Decreased Interest 0  Down, Depressed, Hopeless 0  PHQ - 2 Score 0     The following portions of the patient's history were reviewed and updated as appropriate: allergies, current medications, past family history, past medical history, past social history, past surgical history, and problem list.  Review of Systems Pertinent items noted in HPI and remainder of comprehensive ROS otherwise negative.  Past medical history, past surgical history, family history and social history were all reviewed and documented in the EPIC chart.  Exam:  Vitals:   09/28/23 0911  BP: 102/64  Pulse: 70  SpO2: 99%  Weight: 128 lb (58.1 kg)  Height: 5' 4.5 (1.638 m)   Body mass index is 21.63 kg/m.  General appearance:  Normal Thyroid:  Symmetrical, normal in size, without palpable masses or nodularity. Respiratory  Auscultation:  Clear without wheezing or rhonchi Cardiovascular  Auscultation:  Regular rate, without rubs, murmurs or gallops  Edema/varicosities:  Not grossly evident Abdominal  Soft,nontender, without masses, guarding or rebound.  Liver/spleen:  No organomegaly noted  Hernia:  None appreciated  Skin  Inspection:  Grossly normal Breasts: Examined lying and  sitting.   Right: Without masses, retractions, nipple discharge or axillary adenopathy.   Left: Without masses, retractions, nipple discharge or axillary adenopathy. Genitourinary   Inguinal/mons:  Normal without inguinal adenopathy  External genitalia:  Normal appearing vulva with no masses, tenderness, or lesions  BUS/Urethra/Skene's glands:  Normal  Vagina:  Normal appearing with normal color and discharge, no lesions. Atrophy: mild   Cervix:  Normal appearing without discharge or lesions  Uterus:  Normal in size, shape and contour.  Midline and mobile, nontender  Adnexa/parametria:     Rt: Normal in size, without masses or tenderness.   Lt: Normal in size, without masses or tenderness.  Anus and perineum: Normal    Darice Hoit, CMA present for exam  Assessment/Plan:   1. Well woman exam with routine gynecological exam (Primary) - Cytology - PAP( [facility]) - Schedule mammogram - Labs with PCP  2. Hormone replacement therapy (HRT) - progesterone  (PROMETRIUM ) 100 MG capsule; Take 1 capsule (100 mg total) by mouth at bedtime.  Dispense: 90 capsule; Refill: 4 - estradiol  (VIVELLE -DOT) 0.05 MG/24HR patch; Place 1 patch (0.05 mg total) onto the skin 2 (two) times a week.  Dispense: 24 patch; Refill: 4  3. Osteopenia after menopause Increase strength training, continue yoga Repeat DEXA 2027  4. Depression screen    Return in 1  year for annual or sooner prn.  Jamya Starry B WHNP-BC, 9:46 AM 09/28/2023

## 2023-10-02 ENCOUNTER — Ambulatory Visit: Payer: Self-pay | Admitting: Radiology

## 2023-10-02 LAB — CYTOLOGY - PAP
Comment: NEGATIVE
Diagnosis: NEGATIVE
High risk HPV: NEGATIVE

## 2024-10-01 ENCOUNTER — Ambulatory Visit: Admitting: Radiology
